# Patient Record
Sex: Female | Born: 1955 | Race: White | Hispanic: No | Marital: Married | Smoking: Former smoker
Health system: Southern US, Community
[De-identification: ages and names within clinical notes are randomized; demographics above are authoritative.]

## PROBLEM LIST (undated history)

## (undated) DIAGNOSIS — R7303 Prediabetes: Secondary | ICD-10-CM

## (undated) DIAGNOSIS — R32 Unspecified urinary incontinence: Secondary | ICD-10-CM

## (undated) DIAGNOSIS — Z9889 Other specified postprocedural states: Secondary | ICD-10-CM

## (undated) DIAGNOSIS — K219 Gastro-esophageal reflux disease without esophagitis: Secondary | ICD-10-CM

## (undated) DIAGNOSIS — E785 Hyperlipidemia, unspecified: Secondary | ICD-10-CM

## (undated) HISTORY — PX: CARDIAC CATHETERIZATION: SHX172

---

## 1980-09-20 HISTORY — PX: APPENDECTOMY: SHX54

## 1980-09-20 HISTORY — PX: ABDOMINAL HYSTERECTOMY: SHX81

## 2004-10-08 ENCOUNTER — Ambulatory Visit: Payer: Self-pay | Admitting: Family Medicine

## 2005-09-02 ENCOUNTER — Ambulatory Visit: Payer: Self-pay | Admitting: Gastroenterology

## 2007-05-02 ENCOUNTER — Ambulatory Visit: Payer: Self-pay | Admitting: Family Medicine

## 2007-05-09 ENCOUNTER — Ambulatory Visit: Payer: Self-pay | Admitting: Family Medicine

## 2007-05-09 HISTORY — PX: OTHER SURGICAL HISTORY: SHX169

## 2007-05-10 ENCOUNTER — Ambulatory Visit: Payer: Self-pay | Admitting: Family Medicine

## 2007-07-06 ENCOUNTER — Ambulatory Visit: Payer: Self-pay | Admitting: Cardiology

## 2007-07-14 ENCOUNTER — Ambulatory Visit: Payer: Self-pay | Admitting: Internal Medicine

## 2007-08-15 ENCOUNTER — Telehealth (INDEPENDENT_AMBULATORY_CARE_PROVIDER_SITE_OTHER): Payer: Self-pay | Admitting: *Deleted

## 2007-08-16 ENCOUNTER — Ambulatory Visit: Payer: Self-pay | Admitting: Cardiology

## 2007-09-05 ENCOUNTER — Encounter: Payer: Self-pay | Admitting: Family Medicine

## 2007-09-12 ENCOUNTER — Encounter: Payer: Self-pay | Admitting: Internal Medicine

## 2007-09-21 ENCOUNTER — Encounter: Payer: Self-pay | Admitting: Family Medicine

## 2007-10-04 ENCOUNTER — Encounter: Payer: Self-pay | Admitting: Internal Medicine

## 2007-10-22 ENCOUNTER — Encounter: Payer: Self-pay | Admitting: Family Medicine

## 2007-11-17 ENCOUNTER — Encounter: Payer: Self-pay | Admitting: Internal Medicine

## 2007-11-19 ENCOUNTER — Encounter: Payer: Self-pay | Admitting: Family Medicine

## 2007-11-24 ENCOUNTER — Ambulatory Visit (HOSPITAL_COMMUNITY): Admission: RE | Admit: 2007-11-24 | Discharge: 2007-11-24 | Payer: Self-pay | Admitting: Cardiology

## 2007-11-27 ENCOUNTER — Encounter: Payer: Self-pay | Admitting: Internal Medicine

## 2007-12-15 DIAGNOSIS — E785 Hyperlipidemia, unspecified: Secondary | ICD-10-CM | POA: Insufficient documentation

## 2007-12-18 ENCOUNTER — Ambulatory Visit: Payer: Self-pay | Admitting: Internal Medicine

## 2007-12-18 DIAGNOSIS — F17200 Nicotine dependence, unspecified, uncomplicated: Secondary | ICD-10-CM

## 2007-12-18 DIAGNOSIS — J4489 Other specified chronic obstructive pulmonary disease: Secondary | ICD-10-CM | POA: Insufficient documentation

## 2007-12-18 DIAGNOSIS — J449 Chronic obstructive pulmonary disease, unspecified: Secondary | ICD-10-CM

## 2008-01-09 ENCOUNTER — Ambulatory Visit: Payer: Self-pay | Admitting: Internal Medicine

## 2008-01-09 DIAGNOSIS — R0602 Shortness of breath: Secondary | ICD-10-CM | POA: Insufficient documentation

## 2008-01-10 ENCOUNTER — Encounter: Payer: Self-pay | Admitting: Internal Medicine

## 2008-01-23 ENCOUNTER — Ambulatory Visit (HOSPITAL_COMMUNITY): Admission: RE | Admit: 2008-01-23 | Discharge: 2008-01-23 | Payer: Self-pay | Admitting: Internal Medicine

## 2008-01-23 ENCOUNTER — Encounter: Payer: Self-pay | Admitting: Internal Medicine

## 2008-01-26 ENCOUNTER — Telehealth: Payer: Self-pay | Admitting: Internal Medicine

## 2008-02-13 ENCOUNTER — Ambulatory Visit: Payer: Self-pay | Admitting: Internal Medicine

## 2008-04-06 ENCOUNTER — Telehealth: Payer: Self-pay | Admitting: Internal Medicine

## 2008-04-15 ENCOUNTER — Ambulatory Visit: Payer: Self-pay | Admitting: Pulmonary Disease

## 2008-04-15 ENCOUNTER — Ambulatory Visit: Payer: Self-pay | Admitting: Internal Medicine

## 2008-04-19 ENCOUNTER — Telehealth: Payer: Self-pay | Admitting: Adult Health

## 2008-07-04 ENCOUNTER — Ambulatory Visit: Payer: Self-pay | Admitting: Family Medicine

## 2008-09-20 HISTORY — PX: CHOLECYSTECTOMY: SHX55

## 2008-12-03 ENCOUNTER — Ambulatory Visit: Payer: Self-pay | Admitting: Family Medicine

## 2009-02-03 ENCOUNTER — Ambulatory Visit: Payer: Self-pay | Admitting: Unknown Physician Specialty

## 2009-02-08 ENCOUNTER — Ambulatory Visit: Payer: Self-pay | Admitting: Unknown Physician Specialty

## 2009-02-12 ENCOUNTER — Ambulatory Visit: Payer: Self-pay

## 2009-02-28 ENCOUNTER — Ambulatory Visit: Payer: Self-pay | Admitting: Gastroenterology

## 2009-02-28 LAB — HM COLONOSCOPY

## 2009-03-18 ENCOUNTER — Ambulatory Visit: Payer: Self-pay | Admitting: Gastroenterology

## 2009-03-20 ENCOUNTER — Ambulatory Visit: Payer: Self-pay | Admitting: Surgery

## 2010-01-11 ENCOUNTER — Observation Stay: Payer: Self-pay | Admitting: Internal Medicine

## 2010-02-02 ENCOUNTER — Ambulatory Visit: Payer: Self-pay | Admitting: Gastroenterology

## 2010-02-02 HISTORY — PX: UPPER GI ENDOSCOPY: SHX6162

## 2010-02-16 ENCOUNTER — Ambulatory Visit: Payer: Self-pay | Admitting: Gastroenterology

## 2010-02-22 ENCOUNTER — Emergency Department: Payer: Self-pay | Admitting: Emergency Medicine

## 2010-11-07 IMAGING — NM NUCLEAR MEDICINE HEPATOHBILIARY INCLUDE GB
2 series · 14 of 14 positions shown · non-contrast
Comparison: none

REASON FOR EXAM: abd pain  change bowel habits  weight gain
COMMENTS:

[Series 1000: gallbladder statics · 4.80mm/px · 8 of 8 slices shown]
[im 1/8]
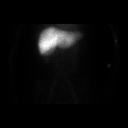
[im 2/8]
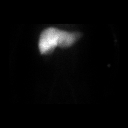
[im 3/8]
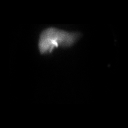
[im 4/8]
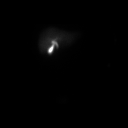
[im 5/8]
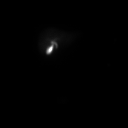
[im 6/8]
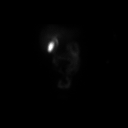
[im 7/8]
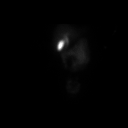
[im 8/8]
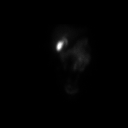

[Series 1000: gallbladder dynamic · 4.80mm/px · 6 of 60 frames shown]
[frame 6/60]
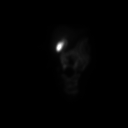
[frame 16/60]
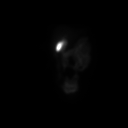
[frame 26/60]
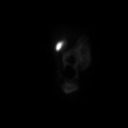
[frame 36/60]
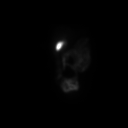
[frame 46/60]
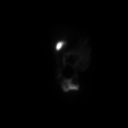
[frame 56/60]
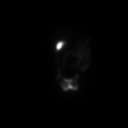

[14 of 14 positions shown; findings below may reference images not displayed]

PROCEDURE:     NM  - NM HEPATO WITH GB EJECT FRACTION  - February 08, 2009 [DATE]

RESULT:     The patient received an injection of 8.76  technetium 99m
Choletec. There is prompt extraction of tracer from the blood pool by the
liver. No hepatic lesion is evident. Common bile duct activity is present by
10 minutes with gallbladder activity present by 20 minutes. Small bowel
activity is seen by 30 to 40 minutes. A continuous infusion of sincalide was
initiated with a total dose of 1.11 mcg administered intravenously over 30
minutes. The gallbladder ejection fraction is low at 19%.
IMPRESSION: 1. Normal-appearing hepatobiliary scan.
2. Depressed gallbladder ejection fraction of 19% in response to sincalide
infusion.

## 2011-02-02 NOTE — Letter (Signed)
July 14, 2007    Cristy Hilts. Jacinto Halim, MD  [address]  [place], [place] [address]   RE:  DELESIA, MARTINEK  MRN:  [id number]  /  DOB:  1956/01/30   Dear Dr. Jacinto Halim:   Thank you for referring Rayan Dyal to my pulmonary clinic. It was a  pleasure to see her. Complaints are shortness of breath. 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 A year ago she was extremely active but then  started noticing tiredness with exertion and also periodic dry cough.  Around March 2008, she noticed dyspnea on exertion along with this  tiredness. In July 2008, she went on a hiking vacation and  experienced  a terrible episode while beginning to hike. She had severe  palpitations and shortness of breath. After that, she reported to your  clinic and by history had a  negative cardiovascular stress test.  Apparently after the stress test, she developed headache and workup in  August 2008 (CT scan of the head) showed a small stroke. She also  diagnosed to have type-2 diabetes at the same time. A month later, she  underwent MRI scanning of the head which confirmed the mini stroke.  She was then recommended aspirin and modification of risk factors.  However, her shortness of breath, fatigue, exhaustion, and dry cough  have persisted. She has undergone pulmonary function testing and is  being referred here.   Effort tolerance is less than one flight of stairs. Gets dyspneic when  carrying her groceries from the car to the home and unable to do her 45  minute walk with her daughter that she used to do before. Associated  symptoms include atypical chest pains, occasional wheezing, and the dry  cough. Of note, she denied hemoptysis, edema, paroxysmal nocturnal  dyspnea, post-nasal drip, and orthopnea.   PAST MEDICAL HISTORY:  1. No coronary artery disease.  2. Continued tobacco abuse. She is trying to quit.  3. Type-2 diabetes  mellitus diagnosed in August 2008.  4. Hyperlipidemia.  5. Mini-stroke in July 2008.   PAST SURGICAL HISTORY:  Status post hysterectomy in 1986.   MEDICATION ALLERGIES:  Allergic to CODEINE and MORPHINE. Unspecified.   MEDICATIONS:  1. Crestor 25 mg once daily.  2. Glucophage 500 mg once daily.  3. Aspirin 81 mg three times daily.   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  Her husband is an Art gallery manager. She has lived in [place]. She has smoked a  pack a day for the past 25 years. She is trying to quit on her own now.  She said that she has enough self-will to do that and she does not  require any help of medications.   GYNECOLOGIC HISTORY:  She is status post menopause.   FAMILY HISTORY:  Father and sister had heart disease. Clotting disorder  existed in father, mother, and paternal grandfather. Cancer affected  people including paternal grandfather, lung cancer in paternal  grandmother, breast cancer in maternal grandmother, and leukemia.   REVIEW OF SYSTEMS:  1. This is documented on the questionnaire. Significant is dysphagia      on and off for one year, and she says that it is not bad, but she      needs to swallow hard.  2.  Left infrascapular region, pain on and off for the past one year      that is increased with cough. Otherwise, the review of systems is      as in the history of present illness and documented in the      questionnaire.   PHYSICAL EXAMINATION:  VITAL SIGNS:  Temperature 98.2, blood pressure  118/82, pulse 88, saturation 97% on room air.  GENERAL:  Pleasant female seated comfortably in the exam room.  CENTRAL NERVOUS SYSTEM:  Awake, alert, and oriented x3. Speech and  ambulation normal.  CARDIOVASCULAR:  Normal heart sounds.  RESPIRATORY:  Normal breath sounds. No wheeze.  NECK:  No neck nodes. No elevated JVP.  ABDOMEN:  Soft and nontender.   EXTREMITIES:  No cyanosis, clubbing, or edema.  SKIN:  Intact.  MUSCULOSKELETAL:  No joint deformities.   LABORATORY DATA:  1. Echocardiogram from March 29, 2007 as reported in your referral      sheets only showed mild mitral regurgitation otherwise normal.  2. Stress test from March 31, 2007 showed EKG without ischemic changes.      She did exercise 7 METS.  3. Lower arterial duplex scan showed no evidence of arterial      insufficiency.  4. Liver function test on June 05, 2007 was normal.  5. CT scan of the head without contrast on May 02, 2007 showed tiny      lacunar infarction in the region of the left lenticular nucleus.  6. Carotid Dopplers on April 23, 2007 showed mild atherosclerotic      changes involving the right carotid bulb and left proximal internal      carotid artery.  7. Pulmonary function tests done at [facility] on July 06, 2007 showed an FEV1 1.26 liters 56%, FVC 2.31 liters 86%, FEV1/FVC      ratio that is reduced at 54%. The uncorrected DLCO is 12.3 mL per      millimeter of mercury per minute 71% of predicted. Flow volume loop      shows sawtooth pattern with an inspiration/expiration. In summary,      spirometry is consistent with Gold stage 2 obstruction and chronic      obstructive pulmonary disease.   ASSESSMENT AND PLAN:  Chronic heavy smoker with dyspnea on exertion and  cough for the past one year consistent with PFTs with Gold stage 2  chronic obstructive pulmonary disease. Her symptoms are consistent with  chronic obstructive pulmonary disease. In addition, she has had  recurrent TIA with a lacunar stroke recently, but without any neurologic  deficits. Cardiac stress test is negative.   PROBLEM LIST:  1. Shortness of breath, wheezing, cough. Symptoms are consistent with      chronic obstructive pulmonary disease and spirometry shows she has      Gold stage 2 chronic obstructive pulmonary disease. The plan is for      her to  have Spiriva 1 puff daily maintenance. I strongly      recommended pulmonary rehabilitation. I advised flu shot today.      Pneumovax will be given at the next visit. She is also strongly      advised quit smoking. She currently wants to quit by herself. We      will follow this up.   1. Dysphagia. Expectant follow up for this for now.   1. Left infrascapular pain. I think that this might be related to some  cough and chronic obstructive pulmonary disease symptoms. Again, we      will see how this pans out after the initiation of steroid      inhalers.   1. Tobacco abuse. She is going to quit on her own. I have offered her      help with medications if she felt that she would need it.   Return to clinic in 3 to 6 months after pulmonary rehab.    Sincerely,      Kalman Shan, MD  Electronically Signed    MR/MedQ  DD: 07/17/2007  DT: 07/17/2007  Job #: [id number]

## 2011-02-02 NOTE — Cardiovascular Report (Signed)
NAMEMATTISON, GOLAY             ACCOUNT NO.:  [id number]   MEDICAL RECORD NO.:  [id number]          PATIENT TYPE:  OIB   LOCATION:  2853                         FACILITY:  [facility]   PHYSICIAN:  Cristy Hilts. Jacinto Halim, MD       DATE OF BIRTH:  [birth year]   DATE OF PROCEDURE:  DATE OF DISCHARGE:                            CARDIAC CATHETERIZATION   PROCEDURE PERFORMED:  1. Right heart catheterization.  2. Left heart catheterization including:      a.     Left ventriculography.      b.     Selective right and left coronary arteriography.   INDICATIONS:  Ms. Hochman is a pleasant 55 year old female with history  of prior stroke, history of smoking, new onset diabetes and severe  obstructive pulmonary artery disease secondary to smoking who has been  complaining of chest discomfort.  She has previously undergone a stress  test which was negative for myocardial ischemia.  Because of persistent  chest discomfort and also shortness of breath.  She is brought to the  catheterization lab to evaluate for coronary artery disease and also to  evaluate pulmonary hypertension.   HEMODYNAMIC DATA:  Right heart catheterization.   RA pressure 2/2 with a mean of 0 mmHg.  RV pressure 16 with end-  diastolic pressure of 4 mmHg.   PA pressure 16/-1 with a mean of 6 mmHg.   Pulmonary capillary wedge 6/4 with a mean of 2 mmHg.   PA saturation was 70% and aortic saturation of 93%.   Cardiac output of 5.1 with a cardiac index of 3.4 by Fick.   HEMODYNAMIC DATA:  Left heart catheterization.   Left ventricular pressure was 104 with end diastolic pressure of 5 mmHg.  Aortic pressure was 104/62 mmHg.  There was no pressure gradient across  the aortic valve.   ANGIOGRAPHIC DATA:  Left ventricle:  Left ventricular systolic function  was normal with ejection fraction of 60-65%.  There was no significant  mitral regurgitation.   Right coronary artery:  Right coronary artery is a large vessel that is  a dominant  vessel.  It is smooth and normal.   Left main coronary artery:  Left main coronary is a large vessel.  Smooth and normal.  Circumflex coronary artery:  Circumflex is large vessel.  It gives  origin to moderate-sized obtuse marginal one.  Smooth and normal.   LAD:  LAD is a moderate-to-large caliber vessel.  It has a proximal mild  calcification and soft eccentric plaque constituting a 20% stenosis.  Otherwise, the LAD itself is smooth and normal.   IMPRESSION:  Mild calcification and soft eccentric plaque in the ostial proximal LAD  constituting about 20% stenosis.  Normal right heart catheterization  pressures without evidence of pulmonary tension.  Normal LV systolic  function.   RECOMMENDATIONS:  Based on angiographic data, aggressive risk  modification is indicated.  The patient will be discharged home today  with outpatient follow-up.  A total of 6 mL of contrast was utilized for  diagnostic angiography.   TECHNIQUE/PROCEDURE:  Under usual sterile precautions using a 7-French  right femoral venous and a 6-French right femoral arterial access, right  and left heart catheterization was performed.   Using a balloon-tip Swan-Ganz catheter which was advanced to the venous  sheath, pulmonary right-sided hemodynamics were carefully performed and  the data was carefully analyzed.  The catheter then pulled out of body.   A 6-French multipurpose B2 catheter was advanced into the ascending  aorta and then into the left ventricle over a J-wire and left  ventriculography was performed both in LAO and RAO projection.  Catheter  was then pulled into the ascending aorta, right coronary artery was  selectively engaged and angiography was performed.  Then the left main  coronary artery was selectively engaged and angiography was performed.  The catheter was pulled out of the body in the usual fashion.  The  patient tolerated procedure well, there were no immediate complications.      Cristy Hilts. Jacinto Halim, MD  Electronically Signed     JRG/MEDQ  D:  11/24/2007  T:  11/26/2007  Job:  [id number]   cc:   Nilda Simmer, M.D.  Cristy Hilts. Jacinto Halim, MD

## 2011-06-14 LAB — POCT I-STAT 3, VENOUS BLOOD GAS (G3P V)
Bicarbonate: 23.8
O2 Saturation: 70
O2 Saturation: 71
TCO2: 25
TCO2: 27
pCO2, Ven: 45.5
pH, Ven: 7.329 — ABNORMAL HIGH
pO2, Ven: 39

## 2011-06-14 LAB — POCT I-STAT 3, ART BLOOD GAS (G3+)
TCO2: 26
pCO2 arterial: 41.5
pH, Arterial: 7.388
pO2, Arterial: 68 — ABNORMAL LOW

## 2011-10-10 IMAGING — CT CT HEAD WITHOUT CONTRAST
2 series · 15 of 30 positions shown, 19 images · non-contrast
Comparison: none

REASON FOR EXAM: focal neuro weakness - difficulty swallowing
COMMENTS:

PROCEDURE:     CT  - CT HEAD WITHOUT CONTRAST  - January 11, 2010 [DATE]
RESULT:     Comparison:  12/03/2008
TECHNIQUE: Multiple axial images from the foramen magnum to the vertex were
obtained without IV contrast.

[Series 2: without · axial · non-contrast · 0.40mm/px · z∈[+316,+436]mm · 13 of 30 slices shown, 17 images]
[im 3/30  brain]
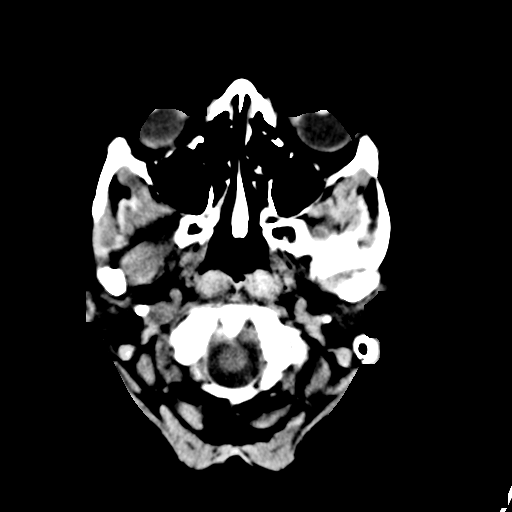
[im 3/30  bone]
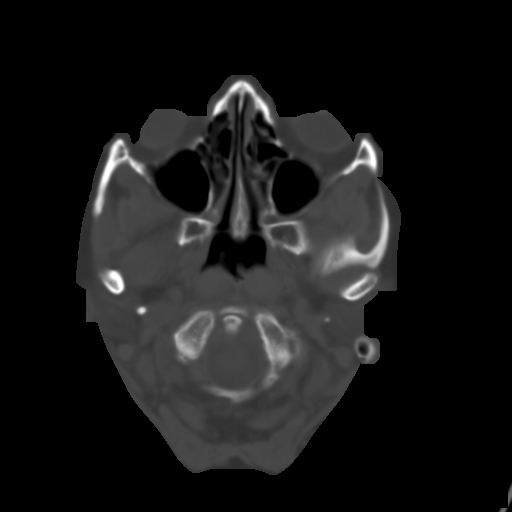
[im 5/30  brain]
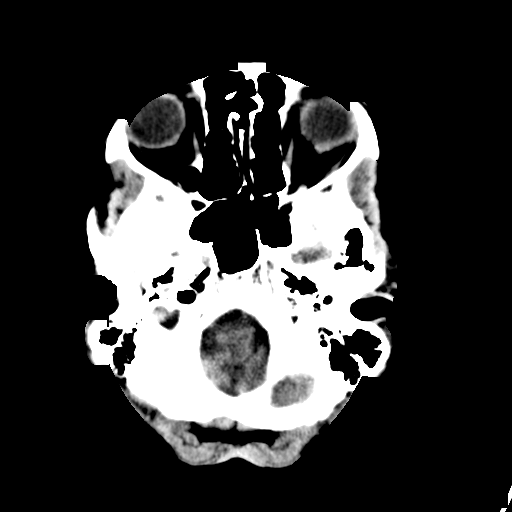
[im 7/30  brain]
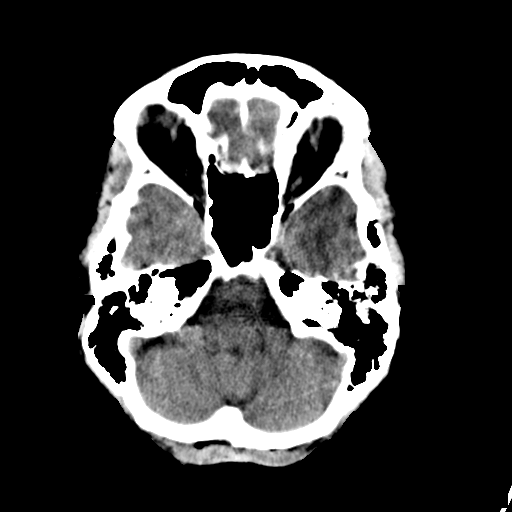
[im 9/30  brain]
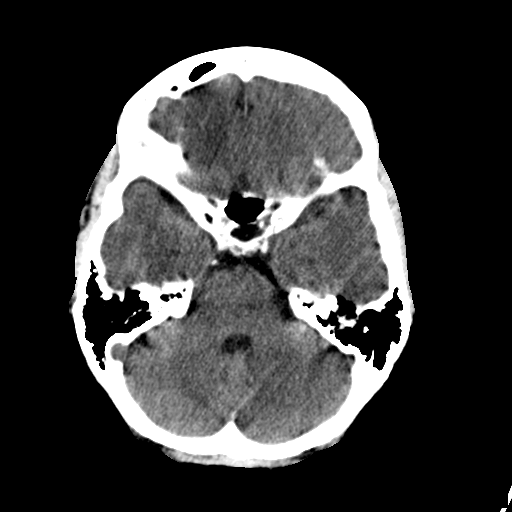
[im 11/30  brain]
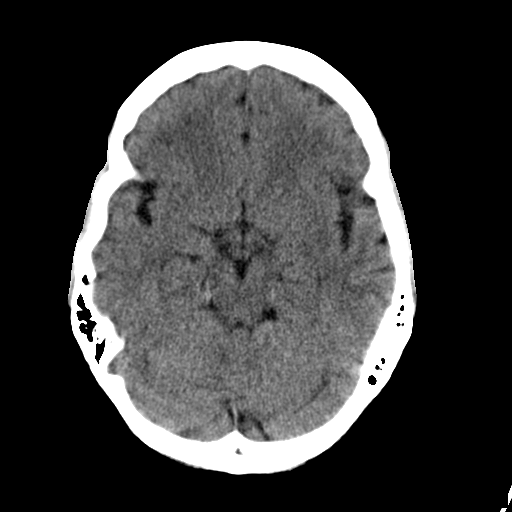
[im 11/30  bone]
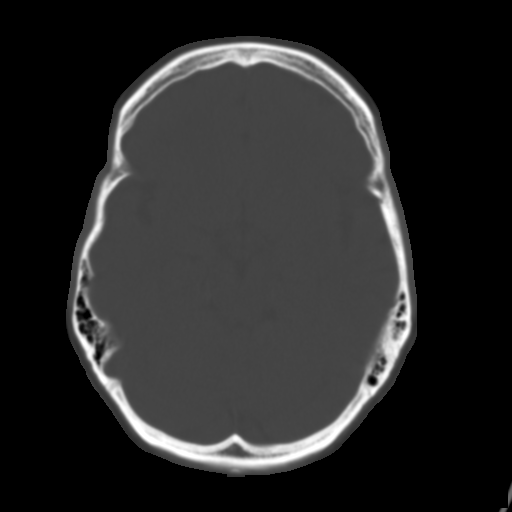
[im 13/30  brain]
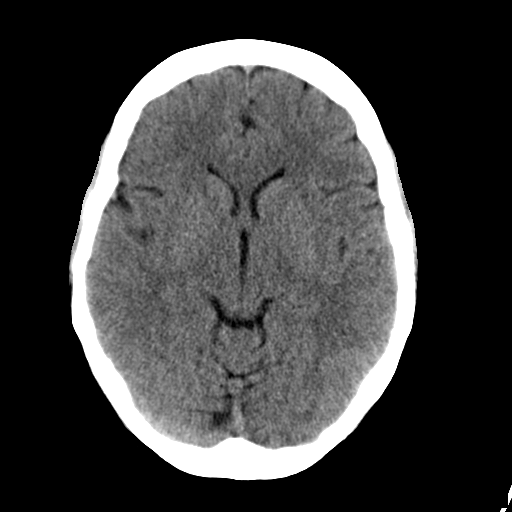
[im 15/30  brain]
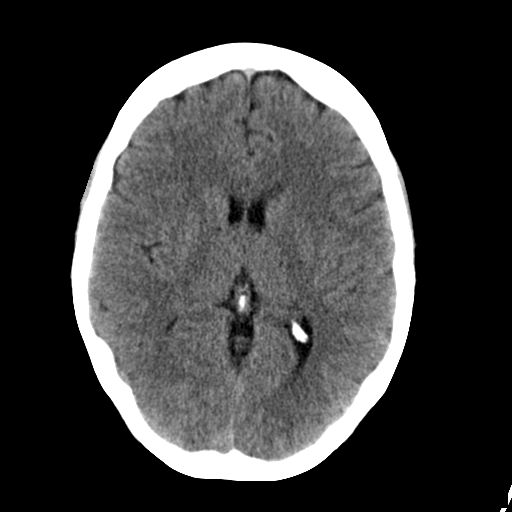
[im 17/30  brain]
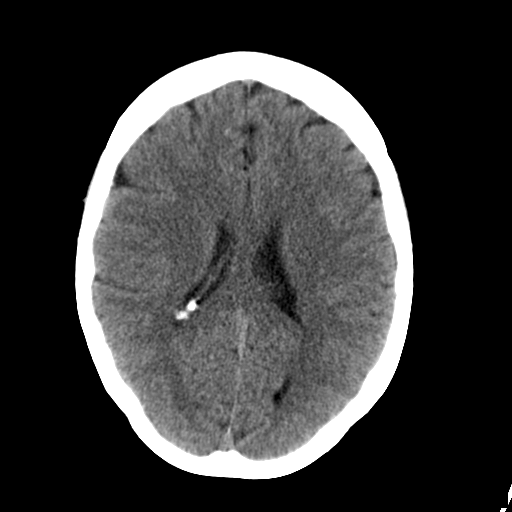
[im 19/30  brain]
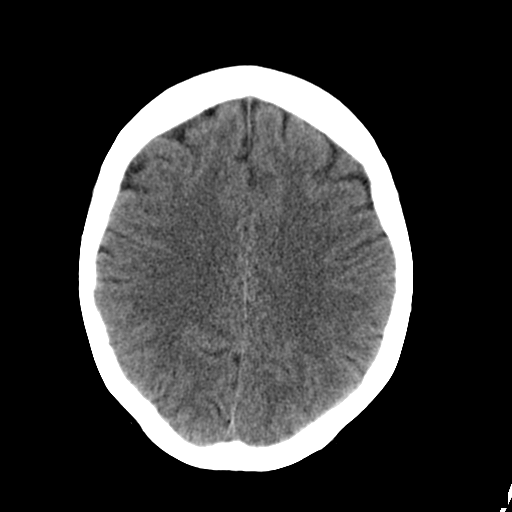
[im 19/30  bone]
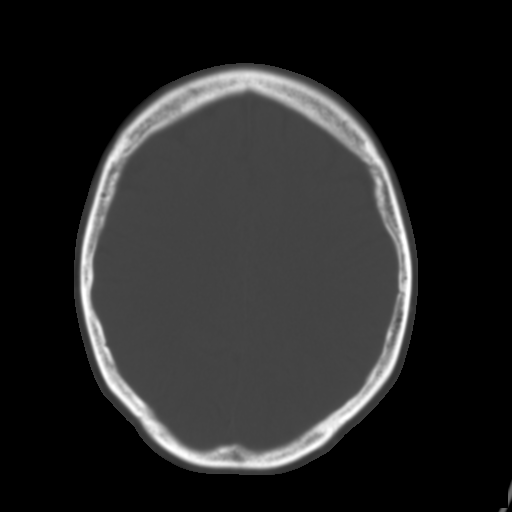
[im 21/30  brain]
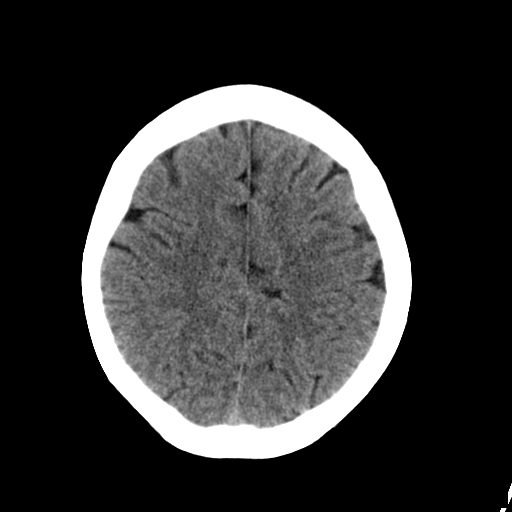
[im 23/30  brain]
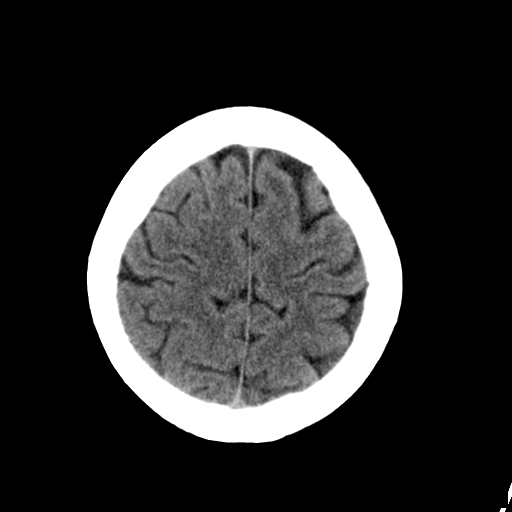
[im 25/30  brain]
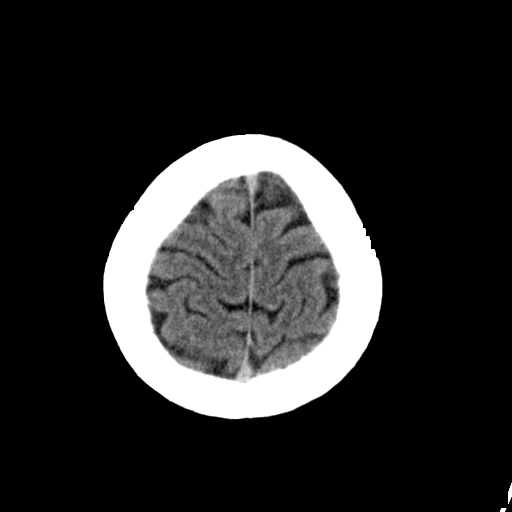
[im 27/30  brain]
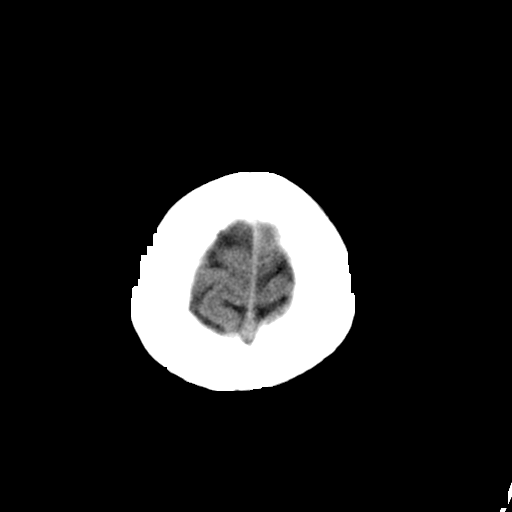
[im 27/30  bone]
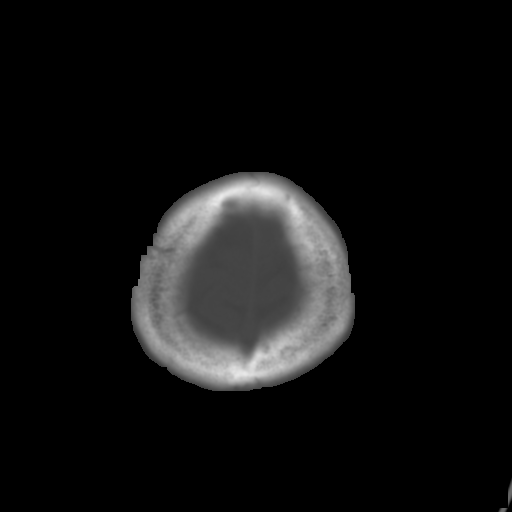

[Series 3: bone · axial · 0.40mm/px · z∈[+316,+336]mm · 2 of 30 slices shown]
[im 3/30  bone]
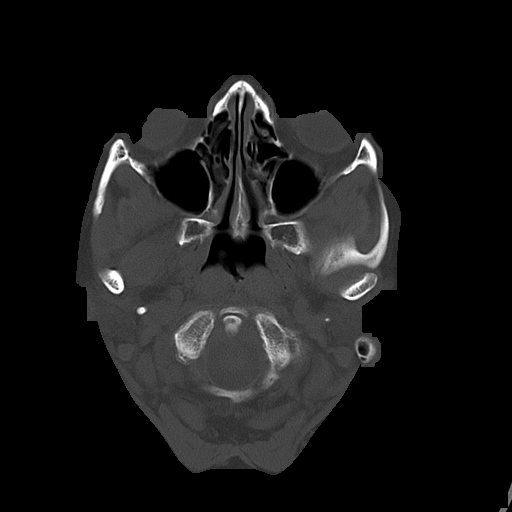
[im 7/30  bone]
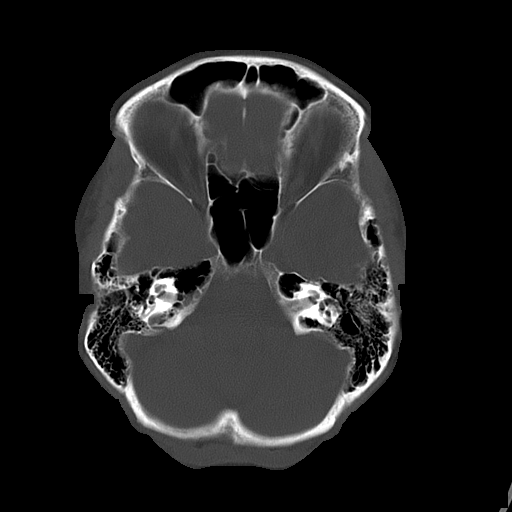

[15 of 30 positions shown; findings below may reference images not displayed]

FINDINGS: There is no evidence of mass effect, midline shift, or extra-axial fluid
collections.  There is no evidence of a space-occupying lesion or
intracranial hemorrhage. There is no evidence of a cortical-based area of
acute infarction.

The ventricles and sulci are appropriate for the patient's age. The basal
cisterns are patent.

Visualized portions of the orbits are unremarkable. The visualized portions
of the paranasal sinuses and mastoid air cells are unremarkable.

The osseous structures are unremarkable.
IMPRESSION: No acute intracranial process.

CT can underestimate ischemia in the first 24 hours after the event. If
there is clinical concern for an acute infarct, a followup MRI or repeat CT
scan in 24 hours may provide additional information.

## 2013-09-11 ENCOUNTER — Ambulatory Visit: Payer: Self-pay | Admitting: Family Medicine

## 2014-06-11 ENCOUNTER — Ambulatory Visit: Payer: Self-pay | Admitting: Family Medicine

## 2015-01-17 LAB — HM MAMMOGRAPHY

## 2016-08-04 ENCOUNTER — Encounter: Payer: Self-pay | Admitting: Family Medicine

## 2016-09-01 ENCOUNTER — Ambulatory Visit (INDEPENDENT_AMBULATORY_CARE_PROVIDER_SITE_OTHER): Payer: BLUE CROSS/BLUE SHIELD | Admitting: Family Medicine

## 2016-09-01 ENCOUNTER — Encounter: Payer: Self-pay | Admitting: Family Medicine

## 2016-09-01 VITALS — BP 108/64 | HR 72 | Temp 98.6°F | Resp 16 | Ht 60.0 in | Wt 126.0 lb

## 2016-09-01 DIAGNOSIS — Z1231 Encounter for screening mammogram for malignant neoplasm of breast: Secondary | ICD-10-CM

## 2016-09-01 DIAGNOSIS — Z0000 Encounter for general adult medical examination without abnormal findings: Secondary | ICD-10-CM | POA: Diagnosis not present

## 2016-09-01 DIAGNOSIS — R739 Hyperglycemia, unspecified: Secondary | ICD-10-CM | POA: Diagnosis not present

## 2016-09-01 DIAGNOSIS — E785 Hyperlipidemia, unspecified: Secondary | ICD-10-CM | POA: Diagnosis not present

## 2016-09-01 DIAGNOSIS — Z1239 Encounter for other screening for malignant neoplasm of breast: Secondary | ICD-10-CM

## 2016-09-01 DIAGNOSIS — Z87891 Personal history of nicotine dependence: Secondary | ICD-10-CM

## 2016-09-01 NOTE — Progress Notes (Signed)
Patient: Erika Smith, Female    DOB: 1955-10-30, 60 y.o.   MRN: [id number] Visit Date: 09/01/2016  Today's Provider: Mila Merryonald Fisher, MD   Chief Complaint  Patient presents with  . Annual Exam   Subjective:    Annual physical exam Erika Smith is a 60 y.o. female who presents today for health maintenance and complete physical. She feels fairly well. She reports exercising not regularly. She reports she is sleeping well.  Mammogram- 01/17/2015. Normal. Colonoscopy- 02/28/2009. Internal hemorrhoids, otherwise normal.     Lipid/Cholesterol, Follow-up:   Last seen for this2 years ago.  Management changes since that visit include no changes. . Last Lipid Panel: No results found for: CHOL, TRIG, HDL, CHOLHDL, VLDL, LDLCALC, LDLDIRECT   She reports good compliance with treatment. She is not having side effects.  Current symptoms include none and have been stable. Weight trend: stable Prior visit with dietician: no Current diet: well balanced Current exercise: walking  Wt Readings from Last 3 Encounters:  09/01/16 126 lb (57.2 kg)  04/15/08 122 lb 2.1 oz (55.4 kg)  01/09/08 119 lb (54 kg)     She reports history of diabetes previously on metformin, but made significant improved to diet and has been off medications for several years. She wa also on Crestor in the past  Review of Systems  Constitutional: Negative.   HENT: Positive for congestion and sore throat.   Eyes: Negative.   Respiratory: Positive for cough.   Cardiovascular: Negative.   Gastrointestinal: Negative.   Endocrine: Negative.   Genitourinary: Negative.   Musculoskeletal: Negative.   Skin: Negative.   Allergic/Immunologic: Negative.   Neurological: Negative.   Hematological: Negative.   Psychiatric/Behavioral: Negative.     Social History      She  reports that she quit smoking about 9 years ago. She does not have any smokeless tobacco history on file. She reports that she does  not drink alcohol or use drugs.       Social History   Social History  . Marital status: Married    Spouse name: N/A  . Number of children: 2  . 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 Smoking status: Former Smoker    Quit date: 2008  . Smokeless tobacco: None  . Alcohol use No  . Drug use: No  . Sexual activity: Not Asked   Other Topics Concern  . None   Social History Narrative  . None    No past medical history on file.   Patient Active Problem List   Diagnosis Date Noted  . SHORTNESS OF BREATH (SOB) 01/09/2008  . TOBACCO ABUSE 12/18/2007  . Chronic airway obstruction, not elsewhere classified 12/18/2007  . HYPERLIPIDEMIA 12/15/2007    Past Surgical History:  Procedure Laterality Date  . ABDOMINAL HYSTERECTOMY  1982   BSO  . APPENDECTOMY  1982  . CARDIAC CATHETERIZATION     11/2007  . CHOLECYSTECTOMY  2010  . MR Brain, Brain Stem  05/09/2007    Normal MRI.  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 UPPER GI ENDOSCOPY  02/02/2010   Dr. Bluford Kaufmannh. LA Grade A reflux esophagitis.    Family History        Family Status  Relation Status  . Mother Deceased   cause of death: Stroke  .  Father Alive   had CABG  . Sister Alive  . Brother Alive  . Other   . Sister Alive  . Brother Alive        Her family history includes Allergies in her sister; Asthma in her sister; Diabetes Mellitus II in her father; Heart disease in her father; Leukemia in her other; Stroke in her father and mother.     Allergies  Allergen Reactions  . Codeine   . Morphine   . Percocet  [Oxycodone-Acetaminophen]   . Tramadol Hcl      Current Outpatient Prescriptions:  Marland Kitchen.  Multiple Vitamins-Minerals (MULTIVITAMIN ADULT PO), Take 1 tablet by mouth daily., Disp: , Rfl:    Patient Care Team: Malva Limesonald E Fisher, MD as PCP - General (Family Medicine)       Objective:   Vitals: BP 108/64 (BP Location: Right Arm, Patient Position: Sitting, Cuff Size: Normal)   Pulse 72   Temp 98.6 F (37 C)   Resp 16   Ht 5' (1.524 m)   Wt 126 lb (57.2 kg)   BMI 24.61 kg/m    Physical Exam  Constitutional: She is oriented to person, place, and time. She appears well-developed and well-nourished.  HENT:  Head: Normocephalic and atraumatic.  Right Ear: External ear normal.  Nose: Nose normal.  Mouth/Throat: Oropharynx is clear and moist.  Eyes: Conjunctivae and EOM are normal. Pupils are equal, round, and reactive to light.  Cardiovascular: Normal rate, regular rhythm and normal heart sounds.   Pulmonary/Chest: Effort normal and breath sounds normal. Right breast exhibits no inverted nipple, no mass, no nipple discharge, no skin change and no tenderness. Left breast exhibits no inverted nipple, no mass, no nipple discharge, no skin change and no tenderness. Breasts are symmetrical.  Abdominal: Soft.  Musculoskeletal: Normal range of motion.  Neurological: She is alert and oriented to person, place, and time.  Skin: Skin is warm and dry.  Psychiatric: She has a normal mood and affect. Her behavior is normal. Judgment and thought content normal.     Depression Screen No flowsheet data found.    Assessment & Plan:     Routine Health Maintenance and Physical Exam  Exercise Activities and Dietary recommendations Goals    None      Immunization History  Administered Date(s) Administered  . Pneumococcal Polysaccharide-23 07/19/2007  . Td 04/29/2005    Health Maintenance  Topic Date Due  . Hepatitis C Screening  11/09/1955  . HIV Screening  08/18/1969  . PAP SMEAR  08/19/1975  . ZOSTAVAX  08/18/2014  . TETANUS/TDAP  04/30/2015  . INFLUENZA VACCINE  04/20/2016  . MAMMOGRAM  01/16/2017  . COLONOSCOPY  03/01/2019     Discussed health benefits of physical activity, and encouraged her to engage in regular exercise appropriate for her  age and condition.   1. Annual physical exam   2. History of smoking 30 or more pack years  - CT CHEST LUNG CA SCREEN LOW DOSE W/O CM; Future  3. Hyperlipidemia, unspecified hyperlipidemia type  - Lipid panel - Comprehensive metabolic panel  4. Hyperglycemia  - Hemoglobin A1c  5. Breast cancer screening  - MM Digital Screening; Future     Mila Merryonald Fisher, MD  [facility]

## 2016-09-04 LAB — LIPID PANEL
Chol/HDL Ratio: 2.6 ratio units (ref 0.0–4.4)
Cholesterol, Total: 233 mg/dL — ABNORMAL HIGH (ref 100–199)
HDL: 90 mg/dL (ref >39)
LDL CALC: 128 mg/dL — AB (ref 0–99)
Triglycerides: 75 mg/dL (ref 0–149)
VLDL CHOLESTEROL CAL: 15 mg/dL (ref 5–40)

## 2016-09-04 LAB — COMPREHENSIVE METABOLIC PANEL
ALBUMIN: 4.4 g/dL (ref 3.6–4.8)
ALK PHOS: 85 IU/L (ref 39–117)
ALT: 16 IU/L (ref 0–32)
AST: 17 IU/L (ref 0–40)
Albumin/Globulin Ratio: 1.8 (ref 1.2–2.2)
BUN/Creatinine Ratio: 30 — ABNORMAL HIGH (ref 12–28)
BUN: 20 mg/dL (ref 8–27)
Bilirubin Total: 0.3 mg/dL (ref 0.0–1.2)
CALCIUM: 9.7 mg/dL (ref 8.7–10.3)
CO2: 24 mmol/L (ref 18–29)
CREATININE: 0.67 mg/dL (ref 0.57–1.00)
Chloride: 100 mmol/L (ref 96–106)
GFR calc Af Amer: 109 mL/min/{1.73_m2} (ref >59)
GFR, EST NON AFRICAN AMERICAN: 95 mL/min/{1.73_m2} (ref >59)
GLUCOSE: 96 mg/dL (ref 65–99)
Globulin, Total: 2.4 g/dL (ref 1.5–4.5)
Potassium: 4.2 mmol/L (ref 3.5–5.2)
Sodium: 140 mmol/L (ref 134–144)
Total Protein: 6.8 g/dL (ref 6.0–8.5)

## 2016-09-04 LAB — HEMOGLOBIN A1C
ESTIMATED AVERAGE GLUCOSE: 120 mg/dL
HEMOGLOBIN A1C: 5.8 % — AB (ref 4.8–5.6)

## 2016-09-08 ENCOUNTER — Telehealth: Payer: Self-pay | Admitting: *Deleted

## 2016-09-08 ENCOUNTER — Other Ambulatory Visit: Payer: Self-pay | Admitting: *Deleted

## 2016-09-08 DIAGNOSIS — Z87891 Personal history of nicotine dependence: Secondary | ICD-10-CM

## 2016-09-08 NOTE — Telephone Encounter (Signed)
Received referral for initial lung cancer screening scan. Contacted patient and obtained smoking history,(former, quit 09/20/2006, 48 pack year) as well as answering questions related to screening process. Patient denies signs of lung cancer such as weight loss or hemoptysis. Patient denies comorbidity that would prevent curative treatment if lung cancer were found. Patient is tentatively scheduled for shared decision making visit and CT scan on 09/21/16 at 1:30pm, pending insurance approval from business office.

## 2016-09-21 ENCOUNTER — Ambulatory Visit: Payer: BLUE CROSS/BLUE SHIELD | Attending: Oncology

## 2016-09-21 ENCOUNTER — Inpatient Hospital Stay: Payer: BLUE CROSS/BLUE SHIELD | Admitting: Oncology

## 2016-09-23 ENCOUNTER — Telehealth: Payer: Self-pay | Admitting: *Deleted

## 2016-09-23 NOTE — Telephone Encounter (Signed)
Contacted in follow up of missed lung screening appointment. Patient is agreeable to appointment on 09/28/16 at 1:30pm.

## 2016-09-28 ENCOUNTER — Ambulatory Visit: Admission: RE | Admit: 2016-09-28 | Payer: BLUE CROSS/BLUE SHIELD | Source: Ambulatory Visit

## 2016-09-28 ENCOUNTER — Inpatient Hospital Stay: Payer: BLUE CROSS/BLUE SHIELD | Admitting: Oncology

## 2016-11-22 ENCOUNTER — Encounter: Payer: Self-pay | Admitting: *Deleted

## 2017-06-21 ENCOUNTER — Other Ambulatory Visit: Payer: Self-pay | Admitting: Orthopedic Surgery

## 2017-06-21 DIAGNOSIS — S42252D Displaced fracture of greater tuberosity of left humerus, subsequent encounter for fracture with routine healing: Secondary | ICD-10-CM

## 2017-06-29 ENCOUNTER — Ambulatory Visit
Admission: RE | Admit: 2017-06-29 | Discharge: 2017-06-29 | Disposition: A | Discharge status: Home / Self Care | Payer: BLUE CROSS/BLUE SHIELD | Source: Ambulatory Visit | Attending: Orthopedic Surgery | Admitting: Orthopedic Surgery

## 2017-06-29 DIAGNOSIS — S42214D Unspecified nondisplaced fracture of surgical neck of right humerus, subsequent encounter for fracture with routine healing: Secondary | ICD-10-CM | POA: Diagnosis not present

## 2017-06-29 DIAGNOSIS — M7581 Other shoulder lesions, right shoulder: Secondary | ICD-10-CM | POA: Insufficient documentation

## 2017-06-29 DIAGNOSIS — X58XXXD Exposure to other specified factors, subsequent encounter: Secondary | ICD-10-CM | POA: Insufficient documentation

## 2017-06-29 DIAGNOSIS — S42252D Displaced fracture of greater tuberosity of left humerus, subsequent encounter for fracture with routine healing: Secondary | ICD-10-CM

## 2017-06-29 DIAGNOSIS — S42254D Nondisplaced fracture of greater tuberosity of right humerus, subsequent encounter for fracture with routine healing: Secondary | ICD-10-CM | POA: Diagnosis present

## 2018-02-28 ENCOUNTER — Ambulatory Visit
Admission: RE | Admit: 2018-02-28 | Discharge: 2018-02-28 | Disposition: A | Discharge status: Home / Self Care | Payer: BLUE CROSS/BLUE SHIELD | Source: Ambulatory Visit | Attending: Family Medicine | Admitting: Family Medicine

## 2018-02-28 ENCOUNTER — Encounter: Payer: Self-pay | Admitting: Family Medicine

## 2018-02-28 ENCOUNTER — Ambulatory Visit: Payer: BLUE CROSS/BLUE SHIELD | Admitting: Family Medicine

## 2018-02-28 VITALS — BP 130/70 | HR 95 | Temp 98.4°F | Resp 16 | Wt 136.0 lb

## 2018-02-28 DIAGNOSIS — M5442 Lumbago with sciatica, left side: Secondary | ICD-10-CM

## 2018-02-28 DIAGNOSIS — M25552 Pain in left hip: Secondary | ICD-10-CM

## 2018-02-28 DIAGNOSIS — R29898 Other symptoms and signs involving the musculoskeletal system: Secondary | ICD-10-CM

## 2018-02-28 DIAGNOSIS — M5441 Lumbago with sciatica, right side: Secondary | ICD-10-CM | POA: Diagnosis not present

## 2018-02-28 DIAGNOSIS — M47896 Other spondylosis, lumbar region: Secondary | ICD-10-CM | POA: Insufficient documentation

## 2018-02-28 NOTE — Progress Notes (Signed)
Patient: Erika Smith Female    DOB: [birth year]   62 y.o.   MRN: [id number] Visit Date: 02/28/2018  Today's Provider: Lelon Huh, MD   Chief Complaint  Patient presents with  . Leg Pain    x 3-4 weeks   Subjective:    Leg Pain   Incident onset: 3-4 weeks ago. There was no injury mechanism. The pain is present in the left leg, right leg and left hip (left hip pain started yesterday). The quality of the pain is described as aching. The pain has been worsening since onset. Associated symptoms include muscle weakness and numbness (in legs). Nothing aggravates the symptoms. She has tried acetaminophen and NSAIDs for the symptoms. The treatment provided mild relief.  Pain seems to worsen at night an is keeping her from sleeping. The leg pains and weakness have been going on a few months, but her left hip just started hurting today. She has not had any falls, but her legs feel weak. She has had a little back pain starting today. Has had no injuries that she is aware of.      Allergies  Allergen Reactions  . Codeine   . Morphine   . Percocet  [Oxycodone-Acetaminophen]   . Tramadol Hcl      Current Outpatient Medications:  Marland Kitchen  Multiple Vitamins-Minerals (MULTIVITAMIN ADULT PO), Take 1 tablet by mouth daily., Disp: , Rfl:   Review of Systems  Constitutional: Positive for fatigue. Negative for appetite change, chills and fever.  Respiratory: Negative for chest tightness and shortness of breath.   Cardiovascular: Negative for chest pain and palpitations.  Gastrointestinal: Negative for abdominal pain, nausea and vomiting.  Musculoskeletal: Positive for arthralgias (left hip) and myalgias (both legs).  Neurological: Positive for weakness and numbness (in legs). Negative for dizziness.  Psychiatric/Behavioral: Positive for sleep disturbance.    Social History   Tobacco Use  . Smoking status: Former Smoker    Packs/day: 1.50    Years: 32.00    Pack years: 48.00   Start date: 1976    Last attempt to quit: 2008    Years since quitting: 11.4  . Smokeless tobacco: Never Used  Substance Use Topics  . Alcohol use: No   Objective:   BP 130/70 (BP Location: Left Arm, Patient Position: Sitting, Cuff Size: Normal)   Pulse 95   Temp 98.4 F (36.9 C) (Oral)   Resp 16   Wt 136 lb (61.7 kg)   SpO2 98% Comment: room  air  BMI 26.56 kg/m  Vitals:   02/28/18 1422  BP: 130/70  Pulse: 95  Resp: 16  Temp: 98.4 F (36.9 C)  TempSrc: Oral  SpO2: 98%  Weight: 136 lb (61.7 kg)     Physical Exam  General appearance: alert, well developed, well nourished, cooperative and in no distress Head: Normocephalic, without obvious abnormality, atraumatic Respiratory: Respirations even and unlabored, normal respiratory rate Extremities: Minimal lower mid back tenderness. Straight leg negative, but hip flexion strength is +4 bilaterally. DTRs are +3.      Assessment & Plan:     1. Left hip pain  - DG HIP UNILAT WITH PELVIS 2-3 VIEWS LEFT; Future  2. Weakness of both lower extremities  - DG Lumbar Spine Complete; Future - CBC - CK (Creatine Kinase) - Sed Rate (ESR)  3. Bilateral low back pain with bilateral sciatica, unspecified chronicity  - DG Lumbar Spine Complete; Future       Lelon Huh, MD  [facility]

## 2018-03-01 ENCOUNTER — Telehealth: Payer: Self-pay

## 2018-03-01 ENCOUNTER — Telehealth: Payer: Self-pay | Admitting: Family Medicine

## 2018-03-01 DIAGNOSIS — M479 Spondylosis, unspecified: Secondary | ICD-10-CM

## 2018-03-01 LAB — CBC
Hematocrit: 36.2 % (ref 34.0–46.6)
Hemoglobin: 12.2 g/dL (ref 11.1–15.9)
MCH: 31.8 pg (ref 26.6–33.0)
MCHC: 33.7 g/dL (ref 31.5–35.7)
MCV: 94 fL (ref 79–97)
PLATELETS: 250 10*3/uL (ref 150–450)
RBC: 3.84 x10E6/uL (ref 3.77–5.28)
RDW: 12.8 % (ref 12.3–15.4)
WBC: 6.2 10*3/uL (ref 3.4–10.8)

## 2018-03-01 LAB — SEDIMENTATION RATE: SED RATE: 10 mm/h (ref 0–40)

## 2018-03-01 LAB — CK: CK TOTAL: 93 U/L (ref 24–173)

## 2018-03-01 MED ORDER — PREDNISONE 10 MG PO TABS: ORAL_TABLET | ORAL | 0 refills | Status: DC

## 2018-03-01 NOTE — Telephone Encounter (Signed)
Patient advised of results in separate message.

## 2018-03-01 NOTE — Telephone Encounter (Signed)
Pt called saying she had xrays and labs yesterday.  She seen Dr, Sherrie MustacheFisher.  She wants to know if we have the results back yet  Call back [phone]  [facility]

## 2018-03-01 NOTE — Telephone Encounter (Signed)
Patient advised of results and agrees with treatment plan. Prescription sent into the pharmacy. Follow up appointment scheduled 03/27/2018. This was the only time patient could come in because she would be out of town the previous week.

## 2018-03-01 NOTE — Telephone Encounter (Signed)
-----   Message from Malva Limesonald E Fisher, MD sent at 03/01/2018  8:27 AM EDT ----- Blood tests are normal. 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 This could cause some nerve irritation going to legs. Recommend 12 day course of prednisone and follow up o.v. 2-3 weeks.

## 2018-03-27 ENCOUNTER — Ambulatory Visit: Payer: Self-pay | Admitting: Family Medicine

## 2018-10-17 ENCOUNTER — Encounter: Payer: Self-pay | Admitting: Family Medicine

## 2018-11-14 ENCOUNTER — Encounter: Payer: Self-pay | Admitting: Family Medicine

## 2018-12-04 ENCOUNTER — Other Ambulatory Visit: Payer: Self-pay

## 2018-12-04 ENCOUNTER — Encounter: Payer: Self-pay | Admitting: Family Medicine

## 2018-12-04 ENCOUNTER — Ambulatory Visit (INDEPENDENT_AMBULATORY_CARE_PROVIDER_SITE_OTHER): Payer: Self-pay | Admitting: Family Medicine

## 2018-12-04 VITALS — BP 130/80 | HR 100 | Temp 98.2°F | Resp 16 | Wt 137.6 lb

## 2018-12-04 DIAGNOSIS — R079 Chest pain, unspecified: Secondary | ICD-10-CM

## 2018-12-04 NOTE — Progress Notes (Signed)
Patient: Erika Smith Female    DOB: 06/19/1956   63 y.o.   MRN: [id number] Visit Date: 12/04/2018  Today's Provider: Mila Merry, MD   Chief Complaint  Patient presents with  . Chest Pain   Subjective:     Chest Pain   This is a recurrent problem. The current episode started more than 1 month ago. The onset quality is sudden. The problem occurs intermittently. The problem has been waxing and waning. The pain is present in the substernal region. The pain is mild. The quality of the pain is described as sharp. The pain does not radiate. Associated symptoms include headaches. Pertinent negatives include no abdominal pain, back pain, claudication, cough, diaphoresis, dizziness, exertional chest pressure, fever, hemoptysis, irregular heartbeat, leg pain, lower extremity edema, malaise/fatigue, nausea, near-syncope, numbness, orthopnea, palpitations, PND, shortness of breath, sputum production, syncope, vomiting or weakness. The pain is aggravated by nothing. She has tried nothing for the symptoms.  Her family medical history is significant for heart disease.  Pains last 1-3 minutes like a stabbing, occurring a few times a week. Last episode was a week ago and is having no pain now.   Allergies  Allergen Reactions  . Codeine   . Morphine   . Percocet  [Oxycodone-Acetaminophen]   . Tramadol Hcl      Current Outpatient Medications:  Marland Kitchen  Multiple Vitamins-Minerals (MULTIVITAMIN ADULT PO), Take 1 tablet by mouth daily., Disp: , Rfl:   Review of Systems  Constitutional: Negative for diaphoresis, fever and malaise/fatigue.  Respiratory: Negative for cough, hemoptysis, sputum production and shortness of breath.   Cardiovascular: Positive for chest pain. Negative for palpitations, orthopnea, claudication, syncope, PND and near-syncope.  Gastrointestinal: Negative for abdominal pain, nausea and vomiting.  Musculoskeletal: Negative for back pain.  Neurological: Positive for  headaches. Negative for dizziness, weakness and numbness.    Social History   Tobacco Use  . Smoking status: Former Smoker    Packs/day: 1.50    Years: 32.00    Pack years: 48.00    Start date: 1976    Last attempt to quit: 2008    Years since quitting: 12.2  . Smokeless tobacco: Never Used  Substance Use Topics  . Alcohol use: No   .family history includes Allergies in her sister; Asthma in her sister; Cancer - Other in her sister; Congestive Heart Failure in her father; Diabetes in her brother; Diabetes Mellitus II in her father; Heart disease in her brother, father, and sister; Leukemia in an other family member; Stroke in her father and mother.   Objective:   BP 130/80   Pulse 100   Temp 98.2 F (36.8 C) (Oral)   Resp 16   Wt 137 lb 9.6 oz (62.4 kg)   SpO2 98%   BMI 26.87 kg/m  Vitals:   12/04/18 1353  BP: 130/80  Pulse: 100  Resp: 16  Temp: 98.2 F (36.8 C)  TempSrc: Oral  SpO2: 98%  Weight: 137 lb 9.6 oz (62.4 kg)     Physical Exam   General Appearance:    Alert, cooperative, no distress  Eyes:    PERRL, conjunctiva/corneas clear, EOM's intact       Lungs:     Clear to auscultation bilaterally, respirations unlabored  Heart:    Regular rate and rhythm  Neurologic:   Awake, alert, oriented x 3. No apparent focal neurological           defect.  EKG: no acute changes.     Assessment & Plan    1. Chest pain, unspecified type Not typical cardiac pain, but considering extensive family history of heart disease she needs further cardiac evaluation.  - EKG 12-Lead - NM Myocar Multi W/Spect W/Wall Motion / EF; Future    Patient seen and examined by Dr. Mila Merry note scribed by Sheliah Hatch, NCMA Mila Merry, MD  [facility],

## 2018-12-04 NOTE — Patient Instructions (Signed)
.   Please review the attached list of medications and notify my office if there are any errors.   . Please bring all of your medications to every appointment so we can make sure that our medication list is the same as yours.   

## 2018-12-07 ENCOUNTER — Telehealth: Payer: Self-pay | Admitting: Family Medicine

## 2018-12-07 DIAGNOSIS — R079 Chest pain, unspecified: Secondary | ICD-10-CM

## 2018-12-07 NOTE — Telephone Encounter (Signed)
Erika Smith  w/ NIA thru [employer]  Denied  - miocardio  perfusion Tracking # [id number]   Peer to Peer number [phone] to over turn denial.  Thanks, TGH

## 2018-12-07 NOTE — Telephone Encounter (Signed)
Pt's insurance company wants to know if there is a reason why exercise stress test has not been ordered for pt instead of [id number] - NM MYOCAR MULTI W/SPECT W/WALL MOTION / EF.You can contact them at [phone].Option 3 if you would like to speak peer to peer Tracking # [id number]

## 2018-12-07 NOTE — Telephone Encounter (Signed)
Called for peer to peer review. Myoview was still denied. They require EKG only test first. If negative they might cover additional tests. In that case I would probably just refer her to a cardiologist.  Have placed order for EKG only stress test.

## 2018-12-08 ENCOUNTER — Telehealth: Payer: Self-pay | Admitting: Family Medicine

## 2018-12-08 NOTE — Telephone Encounter (Signed)
Please advise patient we are having trouble getting stress scheduled because of Covid-19. Just to be safe she should take 81mg  aspirin once a day for her heart until we can get tests done.

## 2018-12-08 NOTE — Telephone Encounter (Signed)
LMTCB at [facility] to reschedule

## 2018-12-08 NOTE — Telephone Encounter (Signed)
Pt advised.   Thanks,   -Kimberlynn Lumbra  

## 2018-12-08 NOTE — Telephone Encounter (Signed)
She is having chest pain that may be cardiac. She is at high risk for heart disease. I don't want her to have a heart attack and die. If they can't do the stress test then she needs referral to cardiology.

## 2018-12-08 NOTE — Telephone Encounter (Signed)
Pt's appointment for ETT is scheduled 01/10/19 because of COVID 19.Let me know if you feel patient needs to be scheduled before then,Thanks

## 2018-12-08 NOTE — Telephone Encounter (Signed)
Please review

## 2018-12-12 ENCOUNTER — Ambulatory Visit
Admission: RE | Admit: 2018-12-12 | Discharge: 2018-12-12 | Disposition: A | Discharge status: Home / Self Care | Payer: PRIVATE HEALTH INSURANCE | Source: Ambulatory Visit | Attending: Family Medicine | Admitting: Family Medicine

## 2018-12-12 ENCOUNTER — Telehealth: Payer: Self-pay

## 2018-12-12 ENCOUNTER — Other Ambulatory Visit: Payer: Self-pay

## 2018-12-12 DIAGNOSIS — R079 Chest pain, unspecified: Secondary | ICD-10-CM | POA: Insufficient documentation

## 2018-12-12 LAB — EXERCISE TOLERANCE TEST
CHL CUP RESTING HR STRESS: 78 {beats}/min
CSEPHR: 93 %
Estimated workload: 7.2 METS
Exercise duration (min): 6 min
Exercise duration (sec): 9 s
MPHR: 156 {beats}/min
Peak HR: 146 {beats}/min

## 2018-12-12 NOTE — Telephone Encounter (Signed)
-----   Message from Malva Limes, MD sent at 12/12/2018  1:23 PM EDT ----- EKG stress test is normal. If chest pains have resolved then no further testing needed. If still having chest pains then would recommend referral to cardiology.

## 2018-12-12 NOTE — Telephone Encounter (Signed)
LMTCB 12/12/2018   Thanks,   -Caroline Longie  

## 2018-12-13 NOTE — Telephone Encounter (Signed)
Patient advised. She states she has not had chest pain the past 2 days. She will call back if she experiences recurrent chest pain to request Cardio referral.

## 2018-12-19 ENCOUNTER — Encounter: Payer: Self-pay | Admitting: Family Medicine

## 2019-01-10 ENCOUNTER — Ambulatory Visit: Payer: BLUE CROSS/BLUE SHIELD

## 2019-09-18 DIAGNOSIS — K2970 Gastritis, unspecified, without bleeding: Secondary | ICD-10-CM | POA: Insufficient documentation

## 2019-09-18 DIAGNOSIS — M25539 Pain in unspecified wrist: Secondary | ICD-10-CM | POA: Insufficient documentation

## 2019-09-18 DIAGNOSIS — M549 Dorsalgia, unspecified: Secondary | ICD-10-CM | POA: Insufficient documentation

## 2019-09-18 DIAGNOSIS — E8941 Symptomatic postprocedural ovarian failure: Secondary | ICD-10-CM | POA: Insufficient documentation

## 2019-09-18 DIAGNOSIS — R5383 Other fatigue: Secondary | ICD-10-CM | POA: Insufficient documentation

## 2019-09-18 DIAGNOSIS — G459 Transient cerebral ischemic attack, unspecified: Secondary | ICD-10-CM | POA: Insufficient documentation

## 2019-09-18 DIAGNOSIS — R7303 Prediabetes: Secondary | ICD-10-CM | POA: Insufficient documentation

## 2019-09-18 DIAGNOSIS — N644 Mastodynia: Secondary | ICD-10-CM | POA: Insufficient documentation

## 2019-09-18 DIAGNOSIS — Z8249 Family history of ischemic heart disease and other diseases of the circulatory system: Secondary | ICD-10-CM | POA: Insufficient documentation

## 2019-09-18 DIAGNOSIS — R109 Unspecified abdominal pain: Secondary | ICD-10-CM | POA: Insufficient documentation

## 2019-09-18 DIAGNOSIS — G4700 Insomnia, unspecified: Secondary | ICD-10-CM | POA: Insufficient documentation

## 2019-09-18 DIAGNOSIS — Z0000 Encounter for general adult medical examination without abnormal findings: Secondary | ICD-10-CM | POA: Insufficient documentation

## 2019-09-18 DIAGNOSIS — K222 Esophageal obstruction: Secondary | ICD-10-CM | POA: Insufficient documentation

## 2019-09-18 DIAGNOSIS — R1013 Epigastric pain: Secondary | ICD-10-CM | POA: Insufficient documentation

## 2019-09-18 DIAGNOSIS — Z8719 Personal history of other diseases of the digestive system: Secondary | ICD-10-CM | POA: Insufficient documentation

## 2019-09-19 ENCOUNTER — Ambulatory Visit
Admission: RE | Admit: 2019-09-19 | Discharge: 2019-09-19 | Disposition: A | Discharge status: Home / Self Care | Payer: Medicare Other | Source: Ambulatory Visit | Attending: Family Medicine | Admitting: Family Medicine

## 2019-09-19 ENCOUNTER — Other Ambulatory Visit: Payer: Self-pay

## 2019-09-19 ENCOUNTER — Encounter: Payer: Self-pay | Admitting: Family Medicine

## 2019-09-19 ENCOUNTER — Ambulatory Visit (INDEPENDENT_AMBULATORY_CARE_PROVIDER_SITE_OTHER): Payer: Medicare Other | Admitting: Family Medicine

## 2019-09-19 VITALS — BP 120/73 | HR 86 | Temp 96.8°F | Ht 60.0 in | Wt 132.2 lb

## 2019-09-19 DIAGNOSIS — E785 Hyperlipidemia, unspecified: Secondary | ICD-10-CM

## 2019-09-19 DIAGNOSIS — Z1231 Encounter for screening mammogram for malignant neoplasm of breast: Secondary | ICD-10-CM | POA: Diagnosis not present

## 2019-09-19 DIAGNOSIS — R591 Generalized enlarged lymph nodes: Secondary | ICD-10-CM | POA: Diagnosis not present

## 2019-09-19 NOTE — Progress Notes (Signed)
Patient: Erika Smith Female    DOB: [birth year]   63 y.o.   MRN: [id number] Visit Date: 09/19/2019  Today's Provider: Wilhemena Durie, MD   Chief Complaint  Patient presents with  . Swollen lymph nodes   Subjective:     HPI Patient presents today C/O swollen lymph nodes underarms near breast area. She noticed the swelling 3 weeks agoPatient denies heat and redness. She states under the right arm is worse than the left arm. Patient reports area is tender and sore.  No fevers or systemic symptoms.  Allergies  Allergen Reactions  . Codeine   . Morphine   . Percocet  [Oxycodone-Acetaminophen]   . Tramadol Hcl      Current Outpatient Medications:  Marland Kitchen  Multiple Vitamins-Minerals (MULTIVITAMIN ADULT PO), Take 1 tablet by mouth daily., Disp: , Rfl:   Review of Systems  Constitutional: Negative.   HENT: Negative.   Eyes: Negative.   Respiratory: Negative.   Cardiovascular: Negative.   Endocrine: Negative.   Musculoskeletal: Negative.   Allergic/Immunologic: Negative.   Hematological: Negative.   Psychiatric/Behavioral: Negative.     Social History   Tobacco Use  . Smoking status: Former Smoker    Packs/day: 0.75    Years: 32.00    Pack years: 24.00    Start date: 1976    Quit date: 2008    Years since quitting: 13.0  . Smokeless tobacco: Never Used  Substance Use Topics  . Alcohol use: No      Objective:   BP 120/73 (BP Location: Right Arm, Patient Position: Sitting, Cuff Size: Normal)   Pulse 86   Temp (!) 96.8 F (36 C) (Temporal)   Ht 5' (1.524 m)   Wt 132 lb 3.2 oz (60 kg)   BMI 25.82 kg/m  Vitals:   09/19/19 1039  BP: 120/73  Pulse: 86  Temp: (!) 96.8 F (36 C)  TempSrc: Temporal  Weight: 132 lb 3.2 oz (60 kg)  Height: 5' (1.524 m)  Body mass index is 25.82 kg/m.   Physical Exam Vitals reviewed.  Constitutional:      Appearance: Normal appearance.  HENT:     Head: Normocephalic and atraumatic.     Right Ear: External  ear normal.     Left Ear: External ear normal.  Eyes:     General: No scleral icterus.    Conjunctiva/sclera: Conjunctivae normal.  Cardiovascular:     Rate and Rhythm: Normal rate and regular rhythm.     Heart sounds: Normal heart sounds.  Pulmonary:     Breath sounds: Normal breath sounds.  Musculoskeletal:        General: No swelling.  Lymphadenopathy:     Cervical: No cervical adenopathy.  Skin:    General: Skin is warm and dry.  Neurological:     General: No focal deficit present.     Mental Status: She is alert and oriented to person, place, and time.  Psychiatric:        Mood and Affect: Mood normal.        Behavior: Behavior normal.        Thought Content: Thought content normal.        Judgment: Judgment normal.      No results found for any visits on 09/19/19.     Assessment & Plan    1. Lymphadenopathy No lymphadenopathy on exam today.  Obtain chest x-ray and labs and refer back to Dr. Caryn Section in the next  couple of weeks. - CBC with Diff - Comp Met (CMET) - Lipid panel - TSH - DG Chest 2 View - MM Digital Diagnostic Bilat  2. Hyperlipidemia, unspecified hyperlipidemia type  - Lipid panel  3. Screening mammogram, encounter for  - MM Digital Diagnostic Bilat     Wilhemena Durie, MD  [facility]

## 2019-09-19 NOTE — Patient Instructions (Addendum)
1. Lymphadenopathy  - CBC with Diff - Comp Met (CMET) - Lipid panel - TSH - DG Chest 2 View - MM Digital Diagnostic Bilat  2. Hyperlipidemia, unspecified hyperlipidemia type  - Lipid panel  3. Screening mammogram, encounter for  - MM Digital Diagnostic Bilat

## 2019-09-20 LAB — CBC WITH DIFFERENTIAL/PLATELET
Basophils Absolute: 0.1 10*3/uL (ref 0.0–0.2)
Basos: 1 %
EOS (ABSOLUTE): 0.1 10*3/uL (ref 0.0–0.4)
Eos: 3 %
Hematocrit: 38.5 % (ref 34.0–46.6)
Hemoglobin: 13.2 g/dL (ref 11.1–15.9)
Immature Grans (Abs): 0 10*3/uL (ref 0.0–0.1)
Immature Granulocytes: 0 %
Lymphocytes Absolute: 2 10*3/uL (ref 0.7–3.1)
Lymphs: 42 %
MCH: 32.5 pg (ref 26.6–33.0)
MCHC: 34.3 g/dL (ref 31.5–35.7)
MCV: 95 fL (ref 79–97)
Monocytes Absolute: 0.5 10*3/uL (ref 0.1–0.9)
Monocytes: 10 %
Neutrophils Absolute: 2.1 10*3/uL (ref 1.4–7.0)
Neutrophils: 44 %
Platelets: 250 10*3/uL (ref 150–450)
RBC: 4.06 x10E6/uL (ref 3.77–5.28)
RDW: 11.7 % (ref 11.7–15.4)
WBC: 4.7 10*3/uL (ref 3.4–10.8)

## 2019-09-20 LAB — TSH: TSH: 3.28 u[IU]/mL (ref 0.450–4.500)

## 2019-09-20 LAB — COMPREHENSIVE METABOLIC PANEL
ALT: 16 IU/L (ref 0–32)
AST: 20 IU/L (ref 0–40)
Albumin/Globulin Ratio: 1.9 (ref 1.2–2.2)
Albumin: 4.6 g/dL (ref 3.8–4.8)
Alkaline Phosphatase: 98 IU/L (ref 39–117)
BUN/Creatinine Ratio: 32 — ABNORMAL HIGH (ref 12–28)
BUN: 22 mg/dL (ref 8–27)
Bilirubin Total: 0.3 mg/dL (ref 0.0–1.2)
CO2: 23 mmol/L (ref 20–29)
Calcium: 9.7 mg/dL (ref 8.7–10.3)
Chloride: 102 mmol/L (ref 96–106)
Creatinine, Ser: 0.68 mg/dL (ref 0.57–1.00)
GFR calc Af Amer: 106 mL/min/{1.73_m2} (ref >59)
GFR calc non Af Amer: 92 mL/min/{1.73_m2} (ref >59)
Globulin, Total: 2.4 g/dL (ref 1.5–4.5)
Glucose: 111 mg/dL — ABNORMAL HIGH (ref 65–99)
Potassium: 4.2 mmol/L (ref 3.5–5.2)
Sodium: 138 mmol/L (ref 134–144)
Total Protein: 7 g/dL (ref 6.0–8.5)

## 2019-09-20 LAB — LIPID PANEL
Chol/HDL Ratio: 2.8 ratio (ref 0.0–4.4)
Cholesterol, Total: 244 mg/dL — ABNORMAL HIGH (ref 100–199)
HDL: 86 mg/dL (ref >39)
LDL Chol Calc (NIH): 148 mg/dL — ABNORMAL HIGH (ref 0–99)
Triglycerides: 61 mg/dL (ref 0–149)
VLDL Cholesterol Cal: 10 mg/dL (ref 5–40)

## 2019-10-03 ENCOUNTER — Encounter: Payer: Self-pay | Admitting: Family Medicine

## 2019-10-16 ENCOUNTER — Ambulatory Visit: Payer: Self-pay | Admitting: Family Medicine

## 2020-09-03 ENCOUNTER — Telehealth (INDEPENDENT_AMBULATORY_CARE_PROVIDER_SITE_OTHER): Payer: Medicare Other | Admitting: Physician Assistant

## 2020-09-03 DIAGNOSIS — R059 Cough, unspecified: Secondary | ICD-10-CM | POA: Diagnosis not present

## 2020-09-03 NOTE — Progress Notes (Signed)
MyChart Video Visit    Virtual Visit via Video Note   This visit type was conducted due to national recommendations for restrictions regarding the COVID-19 Pandemic (e.g. social distancing) in an effort to limit this patient's exposure and mitigate transmission in our community. This patient is at least at moderate risk for complications without adequate follow up. This format is felt to be most appropriate for this patient at this time. Physical exam was limited by quality of the video and audio technology used for the visit.   Patient location: Home Provider location: Office   I discussed the limitations of evaluation and management by telemedicine and the availability of in person appointments. The patient expressed understanding and agreed to proceed.  Patient: Erika Smith   DOB: 10/05/1955   64 y.o. Female  MRN: [id number] Visit Date: 09/03/2020  Today's healthcare provider: Trey Sailors, PA-C   Chief Complaint  Patient presents with  . Cough  I,Amerigo Mcglory M Jamonte Curfman,acting as a scribe for Trey Sailors, PA-C.,have documented all relevant documentation on the behalf of Trey Sailors, PA-C,as directed by  Trey Sailors, PA-C while in the presence of Trey Sailors, PA-C.  Subjective    Cough This is a new problem. The current episode started more than 1 month ago. The problem has been unchanged. The problem occurs every few hours. The cough is productive of sputum. Pertinent negatives include no chills, ear congestion, ear pain, fever, headaches, postnasal drip, rhinorrhea, sore throat, shortness of breath or wheezing. Exacerbated by: eating. She has tried OTC cough suppressant for the symptoms. The treatment provided no relief. Her past medical history is significant for pneumonia. There is no history of asthma, bronchiectasis, bronchitis, COPD, emphysema or environmental allergies.  Patient is concerned due to sister passing away 2 years ago from lung cancer and  she had the same cough. Patient reports she had a cough ongoing for two months, she notices a cough increases after eating. She denies fevers, chills, illness symptoms.   She was diagnosed with obstructive pulmonary disease in 2012 when in the hospital but ultimately followed up with pulmonology and reported she did not have this upon retesting. She is concerned about lung cancer.     Medications: Outpatient Medications Prior to Visit  Medication Sig  . Multiple Vitamins-Minerals (MULTIVITAMIN ADULT PO) Take 1 tablet by mouth daily.   No facility-administered medications prior to visit.    Review of Systems  Constitutional: Negative for chills, fatigue and fever.  HENT: Negative for congestion, ear pain, postnasal drip, rhinorrhea, sinus pressure, sinus pain, sneezing and sore throat.   Respiratory: Positive for cough. Negative for chest tightness, shortness of breath and wheezing.   Allergic/Immunologic: Negative for environmental allergies.  Neurological: Negative for headaches.      Objective    There were no vitals taken for this visit.   Physical Exam Constitutional:      Appearance: Normal appearance.  Pulmonary:     Effort: Pulmonary effort is normal. No respiratory distress.  Neurological:     Mental Status: She is alert.  Psychiatric:        Mood and Affect: Mood normal.        Behavior: Behavior normal.        Assessment & Plan    1. Cough  Repeat CXR, last CXR 08/2019 normal. Counseled on chronic causes of cough including asthma/COPD, GERD and PND. Suggest she try 20 mg pepcid 30 min before meal on an empty  stomach.   - DG Chest 2 View; Future   No follow-ups on file.     I discussed the assessment and treatment plan with the patient. The patient was provided an opportunity to ask questions and all were answered. The patient agreed with the plan and demonstrated an understanding of the instructions.   The patient was advised to call back or seek an  in-person evaluation if the symptoms worsen or if the condition fails to improve as anticipated.   ITrey Sailors, PA-C, have reviewed all documentation for this visit. The documentation on 09/03/20 for the exam, diagnosis, procedures, and orders are all accurate and complete.  The entirety of the information documented in the History of Present Illness, Review of Systems and Physical Exam were personally obtained by me. Portions of this information were initially documented by [facility] and reviewed by me for thoroughness and accuracy.    Maryella Shivers [facility] [phone] (phone) [phone] (fax)  [facility]

## 2020-09-04 ENCOUNTER — Other Ambulatory Visit: Payer: Self-pay

## 2020-09-04 ENCOUNTER — Ambulatory Visit
Admission: RE | Admit: 2020-09-04 | Discharge: 2020-09-04 | Disposition: A | Discharge status: Home / Self Care | Payer: Medicare Other | Source: Ambulatory Visit | Attending: Physician Assistant | Admitting: Physician Assistant

## 2020-09-04 ENCOUNTER — Ambulatory Visit
Admission: RE | Admit: 2020-09-04 | Discharge: 2020-09-04 | Disposition: A | Discharge status: Home / Self Care | Payer: Medicare Other | Attending: Physician Assistant | Admitting: Physician Assistant

## 2020-09-04 DIAGNOSIS — R059 Cough, unspecified: Secondary | ICD-10-CM | POA: Insufficient documentation

## 2020-09-04 DIAGNOSIS — J90 Pleural effusion, not elsewhere classified: Secondary | ICD-10-CM | POA: Diagnosis not present

## 2020-09-30 NOTE — Progress Notes (Signed)
Subjective:   Erika Smith is a 65 y.o. female who presents for an Initial Medicare Annual Wellness Visit.  I connected with Erika OhmPatricia Smith today by telephone and verified that I am speaking with the correct person using two identifiers. Location patient: home Location provider: work Persons participating in the virtual visit: patient, provider.   I discussed the limitations, risks, security and privacy concerns of performing an evaluation and management service by telephone and the availability of in person appointments. I also discussed with the patient that there may be a patient responsible charge related to this service. The patient expressed understanding and verbally consented to this telephonic visit.    Interactive audio and video telecommunications were attempted between this provider and patient, however failed, due to patient having technical difficulties OR patient did not have access to video capability.  We continued and completed visit with audio only.   Review of Systems    N/A  Cardiac Risk Factors include: advanced age (>5955men, 52>65 women)     Objective:    There were no vitals filed for this visit. There is no height or weight on file to calculate BMI.  Advanced Directives 10/01/2020  Does Patient Have a Medical Advance Directive? No  Does patient want to make changes to medical advance directive? No - Patient declined    Current Medications (verified) Outpatient Encounter Medications as of 10/01/2020  Medication Sig  . Multiple Vitamins-Minerals (MULTIVITAMIN ADULT PO) Take 1 tablet by mouth daily.   No facility-administered encounter medications on file as of 10/01/2020.    Allergies (verified) Codeine, Morphine, Percocet  [oxycodone-acetaminophen], and Tramadol hcl   History: History reviewed. No pertinent past medical history. Past Surgical History:  Procedure Laterality Date  . ABDOMINAL HYSTERECTOMY  1982   BSO  . APPENDECTOMY  1982  .  CARDIAC CATHETERIZATION     11/2007  . CHOLECYSTECTOMY  2010  . MR Brain, Brain Stem  05/09/2007    Normal MRI.  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 UPPER GI ENDOSCOPY  02/02/2010   Dr. Bluford Kaufmannh. LA Grade A reflux esophagitis.   Family History  Problem Relation Age of Onset  . Stroke Mother   . Diabetes Mother   . Diabetes Mellitus II Father   . Stroke Father   . Heart disease Father   . Congestive Heart Failure Father   . Allergies Sister   . Asthma Sister   . Diabetes Brother   . Heart disease Brother   . Leukemia Other   . Heart disease Sister        Had triple bypass  . Cancer - Other Sister        Cancer   Social History   Socioeconomic History  . Marital status: Married    Spouse name: Not on file  . Number of children: 2  . Years of education: Not on file  . Highest education level: Associate degree: occupational, Scientist, product/process developmenttechnical, or vocational program  Occupational History  . 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 Smoking status: Former Smoker    Packs/day: 0.75    Years: 32.00    Pack years: 24.00    Start date: 1976    Quit date: 2008    Years since quitting: 14.0  . Smokeless tobacco: Never Used  Vaping Use  . Vaping Use: Former  . Devices: E cigarette - quit  in 2008  Substance and Sexual Activity  . Alcohol use: No  . Drug use: No  . Sexual activity: Not on file  Other Topics Concern  . Not on file  Social History Narrative  . Not on file   Social Determinants of Health   Financial Resource Strain: Low Risk   . Difficulty of Paying Living Expenses: Not hard at all  Food Insecurity: No Food Insecurity  . Worried About Programme researcher, broadcasting/film/video in the Last Year: Never true  . Ran Out of Food in the Last Year: Never true  Transportation Needs: No Transportation Needs  . Lack of Transportation (Medical): No  . Lack of Transportation (Non-Medical): No  Physical  Activity: Inactive  . Days of Exercise per Week: 0 days  . Minutes of Exercise per Session: 0 min  Stress: No Stress Concern Present  . Feeling of Stress : Not at all  Social Connections: Socially Integrated  . Frequency of Communication with Friends and Family: More than three times a week  . Frequency of Social Gatherings with Friends and Family: More than three times a week  . Attends Religious Services: More than 4 times per year  . Active Member of Clubs or Organizations: Yes  . Attends Banker Meetings: Never  . Marital Status: Married    Tobacco Counseling Counseling given: Not Answered   Clinical Intake:  Pre-visit preparation completed: Yes  Pain : No/denies pain     Nutritional Risks: None Diabetes:  (Prediabetic)  How often do you need to have someone help you when you read instructions, pamphlets, or other written materials from your doctor or pharmacy?: 1 - Never  Diabetic? Prediabetic  Interpreter Needed?: No  Information entered by :: [facility], LPN   Activities of Daily Living In your present state of health, do you have any difficulty performing the following activities: 10/01/2020  Hearing? N  Vision? Y  Comment Needs a new eye glass prescription.  Difficulty concentrating or making decisions? N  Walking or climbing stairs? N  Dressing or bathing? N  Doing errands, shopping? N  Preparing Food and eating ? N  Using the Toilet? N  In the past six months, have you accidently leaked urine? Y  Comment Occasionally with pressure. Wears protection most of the day.  Do you have problems with loss of bowel control? N  Managing your Medications? N  Managing your Finances? N  Housekeeping or managing your Housekeeping? N  Some recent data might be hidden    Patient Care Team: Malva Limes, MD as PCP - General (Family Medicine) [facility], [facility] (Optometry)  Indicate any recent Medical Services you may have received from other  than [facility] providers in the past year (date may be approximate).     Assessment:   This is a routine wellness examination for Erika Smith.  Hearing/Vision screen No exam data present  Dietary issues and exercise activities discussed: Current Exercise Habits: The patient does not participate in regular exercise at present, Exercise limited by: None identified  Goals    . DIET - REDUCE CALORIE INTAKE     Recommend to cut back on all carbohydrates in daily diet.       Depression Screen PHQ 2/9 Scores 10/01/2020 09/19/2019 02/28/2018  PHQ - 2 Score 0 0 0  PHQ- 9 Score - - 3    Fall Risk Fall Risk  10/01/2020 09/19/2019  Falls in the past year? 1 1  Number falls in past yr:  1 0  Injury with Fall? 0 0  Follow up Falls prevention discussed -    FALL RISK PREVENTION PERTAINING TO THE HOME:  Any stairs in or around the home? No  If so, are there any without handrails? No  Home free of loose throw rugs in walkways, pet beds, electrical cords, etc? Yes  Adequate lighting in your home to reduce risk of falls? Yes   ASSISTIVE DEVICES UTILIZED TO PREVENT FALLS:  Life alert? No    Use of a cane, walker or w/c? No  Grab bars in the bathroom? No  Shower chair or bench in shower? Yes  Elevated toilet seat or a handicapped toilet? No    Cognitive Function:      6CIT Screen 10/01/2020  What Year? 0 points  What month? 0 points  What time? 0 points  Count back from 20 0 points  Months in reverse 0 points  Repeat phrase 0 points  Total Score 0    Immunizations Immunization History  Administered Date(s) Administered  . Influenza Split 06/07/2009  . Pneumococcal Polysaccharide-23 07/19/2007  . Td 04/29/2005    TDAP status: Due, Education has been provided regarding the importance of this vaccine. Advised may receive this vaccine at local pharmacy or Health Dept. Aware to provide a copy of the vaccination record if obtained from local pharmacy or Health Dept. Verbalized acceptance  and understanding.  Flu Vaccine status: Declined, Education has been provided regarding the importance of this vaccine but patient still declined. Advised may receive this vaccine at local pharmacy or Health Dept. Aware to provide a copy of the vaccination record if obtained from local pharmacy or Health Dept. Verbalized acceptance and understanding.  Pneumococcal vaccine status: Due, Education has been provided regarding the importance of this vaccine. Advised may receive this vaccine at local pharmacy or Health Dept. Aware to provide a copy of the vaccination record if obtained from local pharmacy or Health Dept. Verbalized acceptance and understanding.  Covid-19 vaccine status: Declined, Education has been provided regarding the importance of this vaccine but patient still declined. Advised may receive this vaccine at local pharmacy or Health Dept.or vaccine clinic. Aware to provide a copy of the vaccination record if obtained from local pharmacy or Health Dept. Verbalized acceptance and understanding.  Qualifies for Shingles Vaccine? Yes   Zostavax completed No   Shingrix Completed?: No.    Education has been provided regarding the importance of this vaccine. Patient has been advised to call insurance company to determine out of pocket expense if they have not yet received this vaccine. Advised may also receive vaccine at local pharmacy or Health Dept. Verbalized acceptance and understanding.  Screening Tests Health Maintenance  Topic Date Due  . Hepatitis C Screening  Never done  . MAMMOGRAM  01/16/2017  . COLONOSCOPY (Pts 45-38yrs Insurance coverage will need to be confirmed)  03/01/2019  . DEXA SCAN  Never done  . PNA vac Low Risk Adult (1 of 2 - PCV13) 08/19/2019  . COVID-19 Vaccine (1) 10/17/2020 (Originally 08/18/1968)  . INFLUENZA VACCINE  12/18/2020 (Originally 04/20/2020)  . TETANUS/TDAP  10/01/2021 (Originally 04/30/2015)    Health Maintenance  Health Maintenance Due  Topic  Date Due  . Hepatitis C Screening  Never done  . MAMMOGRAM  01/16/2017  . COLONOSCOPY (Pts 45-45yrs Insurance coverage will need to be confirmed)  03/01/2019  . DEXA SCAN  Never done  . PNA vac Low Risk Adult (1 of 2 - PCV13) 08/19/2019  Colorectal cancer screening: Referral to GI placed today. Pt aware the office will call re: appt.  Mammogram status: Ordered today. Pt provided with contact info and advised to call to schedule appt.   Bone Density status: Ordered today. Pt provided with contact info and advised to call to schedule appt.  Lung Cancer Screening: (Low Dose CT Chest recommended if Age 35-80 years, 30 pack-year currently smoking OR have quit w/in 15years.) does qualify.   Lung Cancer Screening Referral: An Epic message has been sent to Glenna Fellows, RN (Oncology Nurse Navigator) regarding the possible need for this exam. Ines Bloomer will review the patient's chart to determine if the patient truly qualifies for the exam. If the patient qualifies, Ines Bloomer will order the Low Dose CT of the chest to facilitate the scheduling of this exam.  Additional Screening:  Hepatitis C Screening: does qualify and would like this added to next in office blood work orders.  Vision Screening: Recommended annual ophthalmology exams for early detection of glaucoma and other disorders of the eye. Is the patient up to date with their annual eye exam?  Yes  Who is the provider or what is the name of the office in which the patient attends annual eye exams? [facility] If pt is not established with a provider, would they like to be referred to a provider to establish care? No .   Dental Screening: Recommended annual dental exams for proper oral hygiene  Community Resource Referral / Chronic Care Management: CRR required this visit?  No   CCM required this visit?  No      Plan:     I have personally reviewed and noted the following in the patient's chart:   . Medical and social history . Use of  alcohol, tobacco or illicit drugs  . Current medications and supplements . Functional ability and status . Nutritional status . Physical activity . Advanced directives . List of other physicians . Hospitalizations, surgeries, and ER visits in previous 12 months . Vitals . Screenings to include cognitive, depression, and falls . Referrals and appointments  In addition, I have reviewed and discussed with patient certain preventive protocols, quality metrics, and best practice recommendations. A written personalized care plan for preventive services as well as general preventive health recommendations were provided to patient.     Shann Merrick [place], [place]   03/19/1600   Nurse Notes: Pt would like the Hep C lab order and a Prevnar 13 vaccine at next in office apt. Pt declined receiving a Covid or flu vaccine. Orders placed for a colonoscopy, mammogram and DEXA scan.

## 2020-10-01 ENCOUNTER — Ambulatory Visit (INDEPENDENT_AMBULATORY_CARE_PROVIDER_SITE_OTHER): Payer: Medicare HMO

## 2020-10-01 ENCOUNTER — Other Ambulatory Visit: Payer: Self-pay

## 2020-10-01 DIAGNOSIS — Z1231 Encounter for screening mammogram for malignant neoplasm of breast: Secondary | ICD-10-CM

## 2020-10-01 DIAGNOSIS — Z1211 Encounter for screening for malignant neoplasm of colon: Secondary | ICD-10-CM

## 2020-10-01 DIAGNOSIS — E2839 Other primary ovarian failure: Secondary | ICD-10-CM

## 2020-10-01 DIAGNOSIS — Z0000 Encounter for general adult medical examination without abnormal findings: Secondary | ICD-10-CM | POA: Diagnosis not present

## 2020-10-01 NOTE — Patient Instructions (Signed)
Erika Smith , Thank you for taking time to come for your Medicare Wellness Visit. I appreciate your ongoing commitment to your health goals. Please review the following plan we discussed and let me know if I can assist you in the future.   Screening recommendations/referrals: Colonoscopy: Referral placed today. Pt aware office will contact her AU:QJFH. Mammogram: Ordered today. Please call [phone] to schedule your mammogram.  Bone Density: Ordered today. Pt aware office will contact her HT:DSKA. Recommended yearly ophthalmology/optometry visit for glaucoma screening and checkup Recommended yearly dental visit for hygiene and checkup  Vaccinations: Influenza vaccine: Currently due, declined receiving.  Pneumococcal vaccine: Prevnar 13 due. Will receive at next in office apt.  Tdap vaccine: Currently due, declined receiving.  Shingles vaccine: Shingrix discussed. Please contact your pharmacy for coverage information.     Advanced directives: Advance directive discussed with you today. Even though you declined this today please call our office should you change your mind and we can give you the proper paperwork for you to fill out.  Conditions/risks identified: Recommend to cut back on all carbohydrates in daily diet.   Next appointment: 10/06/21 @ 9:00 AM for an AWV. Declined scheduling a follow up with PCP at this time.    Preventive Care 35 Years and Older, Female Preventive care refers to lifestyle choices and visits with your health care provider that can promote health and wellness. What does preventive care include?  A yearly physical exam. This is also called an annual well check.  Dental exams once or twice a year.  Routine eye exams. Ask your health care provider how often you should have your eyes checked.  Personal lifestyle choices, including:  Daily care of your teeth and gums.  Regular physical activity.  Eating a healthy diet.  Avoiding tobacco and drug  use.  Limiting alcohol use.  Practicing safe sex.  Taking low-dose aspirin every day.  Taking vitamin and mineral supplements as recommended by your health care provider. What happens during an annual well check? The services and screenings done by your health care provider during your annual well check will depend on your age, overall health, lifestyle risk factors, and family history of disease. Counseling  Your health care provider may ask you questions about your:  Alcohol use.  Tobacco use.  Drug use.  Emotional well-being.  Home and relationship well-being.  Sexual activity.  Eating habits.  History of falls.  Memory and ability to understand (cognition).  Work and work Astronomer.  Reproductive health. Screening  You may have the following tests or measurements:  Height, weight, and BMI.  Blood pressure.  Lipid and cholesterol levels. These may be checked every 5 years, or more frequently if you are over 21 years old.  Skin check.  Lung cancer screening. You may have this screening every year starting at age 79 if you have a 30-pack-year history of smoking and currently smoke or have quit within the past 15 years.  Fecal occult blood test (FOBT) of the stool. You may have this test every year starting at age 1.  Flexible sigmoidoscopy or colonoscopy. You may have a sigmoidoscopy every 5 years or a colonoscopy every 10 years starting at age 80.  Hepatitis C blood test.  Hepatitis B blood test.  Sexually transmitted disease (STD) testing.  Diabetes screening. This is done by checking your blood sugar (glucose) after you have not eaten for a while (fasting). You may have this done every 1-3 years.  Bone density scan. This  is done to screen for osteoporosis. You may have this done starting at age 4.  Mammogram. This may be done every 1-2 years. Talk to your health care provider about how often you should have regular mammograms. Talk with your  health care provider about your test results, treatment options, and if necessary, the need for more tests. Vaccines  Your health care provider may recommend certain vaccines, such as:  Influenza vaccine. This is recommended every year.  Tetanus, diphtheria, and acellular pertussis (Tdap, Td) vaccine. You may need a Td booster every 10 years.  Zoster vaccine. You may need this after age 86.  Pneumococcal 13-valent conjugate (PCV13) vaccine. One dose is recommended after age 44.  Pneumococcal polysaccharide (PPSV23) vaccine. One dose is recommended after age 64. Talk to your health care provider about which screenings and vaccines you need and how often you need them. This information is not intended to replace advice given to you by your health care provider. Make sure you discuss any questions you have with your health care provider. Document Released: 10/03/2015 Document Revised: 05/26/2016 Document Reviewed: 07/08/2015 Elsevier Interactive Patient Education  2017 [place] Prevention in the Home Falls can cause injuries. They can happen to people of all ages. There are many things you can do to make your home safe and to help prevent falls. What can I do on the outside of my home?  Regularly fix the edges of walkways and driveways and fix any cracks.  Remove anything that might make you trip as you walk through a door, such as a raised step or threshold.  Trim any bushes or trees on the path to your home.  Use bright outdoor lighting.  Clear any walking paths of anything that might make someone trip, such as rocks or tools.  Regularly check to see if handrails are loose or broken. Make sure that both sides of any steps have handrails.  Any raised decks and porches should have guardrails on the edges.  Have any leaves, snow, or ice cleared regularly.  Use sand or salt on walking paths during winter.  Clean up any spills in your garage right away. This includes oil  or grease spills. What can I do in the bathroom?  Use night lights.  Install grab bars by the toilet and in the tub and shower. Do not use towel bars as grab bars.  Use non-skid mats or decals in the tub or shower.  If you need to sit down in the shower, use a plastic, non-slip stool.  Keep the floor dry. Clean up any water that spills on the floor as soon as it happens.  Remove soap buildup in the tub or shower regularly.  Attach bath mats securely with double-sided non-slip rug tape.  Do not have throw rugs and other things on the floor that can make you trip. What can I do in the bedroom?  Use night lights.  Make sure that you have a light by your bed that is easy to reach.  Do not use any sheets or blankets that are too big for your bed. They should not hang down onto the floor.  Have a firm chair that has side arms. You can use this for support while you get dressed.  Do not have throw rugs and other things on the floor that can make you trip. What can I do in the kitchen?  Clean up any spills right away.  Avoid walking on wet floors.  Keep items that you use a lot in easy-to-reach places.  If you need to reach something above you, use a strong step stool that has a grab bar.  Keep electrical cords out of the way.  Do not use floor polish or wax that makes floors slippery. If you must use wax, use non-skid floor wax.  Do not have throw rugs and other things on the floor that can make you trip. What can I do with my stairs?  Do not leave any items on the stairs.  Make sure that there are handrails on both sides of the stairs and use them. Fix handrails that are broken or loose. Make sure that handrails are as long as the stairways.  Check any carpeting to make sure that it is firmly attached to the stairs. Fix any carpet that is loose or worn.  Avoid having throw rugs at the top or bottom of the stairs. If you do have throw rugs, attach them to the floor with  carpet tape.  Make sure that you have a light switch at the top of the stairs and the bottom of the stairs. If you do not have them, ask someone to add them for you. What else can I do to help prevent falls?  Wear shoes that:  Do not have high heels.  Have rubber bottoms.  Are comfortable and fit you well.  Are closed at the toe. Do not wear sandals.  If you use a stepladder:  Make sure that it is fully opened. Do not climb a closed stepladder.  Make sure that both sides of the stepladder are locked into place.  Ask someone to hold it for you, if possible.  Clearly mark and make sure that you can see:  Any grab bars or handrails.  First and last steps.  Where the edge of each step is.  Use tools that help you move around (mobility aids) if they are needed. These include:  Canes.  Walkers.  Scooters.  Crutches.  Turn on the lights when you go into a dark area. Replace any light bulbs as soon as they burn out.  Set up your furniture so you have a clear path. Avoid moving your furniture around.  If any of your floors are uneven, fix them.  If there are any pets around you, be aware of where they are.  Review your medicines with your doctor. Some medicines can make you feel dizzy. This can increase your chance of falling. Ask your doctor what other things that you can do to help prevent falls. This information is not intended to replace advice given to you by your health care provider. Make sure you discuss any questions you have with your health care provider. Document Released: 07/03/2009 Document Revised: 02/12/2016 Document Reviewed: 10/11/2014 Elsevier Interactive Patient Education  2017 [employer].

## 2020-10-10 ENCOUNTER — Telehealth: Payer: Self-pay | Admitting: *Deleted

## 2020-10-10 DIAGNOSIS — Z122 Encounter for screening for malignant neoplasm of respiratory organs: Secondary | ICD-10-CM

## 2020-10-10 DIAGNOSIS — Z87891 Personal history of nicotine dependence: Secondary | ICD-10-CM

## 2020-10-10 NOTE — Telephone Encounter (Signed)
Received referral for initial lung cancer screening scan. Contacted patient and obtained smoking history,(former, quit 2008, 33 pack year) as well as answering questions related to screening process. Patient denies signs of lung cancer such as weight loss or hemoptysis. Patient denies comorbidity that would prevent curative treatment if lung cancer were found. Patient is scheduled for shared decision making visit and CT scan on 10/21/20 at 145pm.

## 2020-10-10 NOTE — Telephone Encounter (Signed)
Received referral for low dose lung cancer screening CT scan. Message left at phone number listed in EMR for patient to call me back to facilitate scheduling scan.  

## 2020-10-20 LAB — HM MAMMOGRAPHY

## 2020-10-20 LAB — HM DEXA SCAN

## 2020-10-21 ENCOUNTER — Inpatient Hospital Stay: Payer: Medicare HMO | Attending: Nurse Practitioner | Admitting: Nurse Practitioner

## 2020-10-21 ENCOUNTER — Other Ambulatory Visit: Payer: Self-pay

## 2020-10-21 ENCOUNTER — Ambulatory Visit
Admission: RE | Admit: 2020-10-21 | Discharge: 2020-10-21 | Disposition: A | Discharge status: Home / Self Care | Payer: Medicare HMO | Source: Ambulatory Visit | Attending: Nurse Practitioner | Admitting: Nurse Practitioner

## 2020-10-21 DIAGNOSIS — Z122 Encounter for screening for malignant neoplasm of respiratory organs: Secondary | ICD-10-CM | POA: Insufficient documentation

## 2020-10-21 DIAGNOSIS — Z87891 Personal history of nicotine dependence: Secondary | ICD-10-CM

## 2020-10-21 NOTE — Progress Notes (Signed)
Virtual Visit via Video Enabled Telemedicine Note   I connected with Erika Smith on 10/21/20 at 1:45 PM EST by video enabled telemedicine visit and verified that I am speaking with the correct person using two identifiers.   I discussed the limitations, risks, security and privacy concerns of performing an evaluation and management service by telemedicine and the availability of in-person appointments. I also discussed with the patient that there may be a patient responsible charge related to this service. The patient expressed understanding and agreed to proceed.   Other persons participating in the visit and their role in the encounter: Burgess Estelle, RN- checking in patient & navigation  Patient's location: [place]  Provider's location: Clinic  Chief Complaint: Low Dose CT Screening  Patient agreed to evaluation by telemedicine to discuss shared decision making for consideration of low dose CT lung cancer screening.    In accordance with CMS guidelines, patient has met eligibility criteria including age, absence of signs or symptoms of lung cancer.  Social History   Tobacco Use  . Smoking status: Former Smoker    Packs/day: 1.00    Years: 33.00    Pack years: 33.00    Start date: 1976    Quit date: 2008    Years since quitting: 14.0  . Smokeless tobacco: Never Used  Substance Use Topics  . Alcohol use: No     A shared decision-making session was conducted prior to the performance of CT scan. This includes one or more decision aids, includes benefits and harms of screening, follow-up diagnostic testing, over-diagnosis, false positive rate, and total radiation exposure.   Counseling on the importance of adherence to annual lung cancer LDCT screening, impact of co-morbidities, and ability or willingness to undergo diagnosis and treatment is imperative for compliance of the program.   Counseling on the importance of continued smoking cessation for former smokers; the  importance of smoking cessation for current smokers, and information about tobacco cessation interventions have been given to patient including [place] and [address] [employer] [place] programs.   Written order for lung cancer screening with LDCT has been given to the patient and any and all questions have been answered to the best of my abilities.    Yearly follow up will be coordinated by Burgess Estelle, Thoracic Navigator.  I discussed the assessment and treatment plan with the patient. The patient was provided an opportunity to ask questions and all were answered. The patient agreed with the plan and demonstrated an understanding of the instructions.   The patient was advised to call back or seek an in-person evaluation if the symptoms worsen or if the condition fails to improve as anticipated.   I provided 15 minutes of face-to-face video visit time dedicated to the care of this patient on the date of this encounter to include pre-visit review of smoking history, face-to-face time with the patient, and post visit ordering of testing/documentation.   Beckey Rutter, DNP, AGNP-C [place] at [facility] [phone] (clinic)

## 2020-10-22 ENCOUNTER — Telehealth: Payer: Self-pay

## 2020-10-22 DIAGNOSIS — E785 Hyperlipidemia, unspecified: Secondary | ICD-10-CM

## 2020-10-22 DIAGNOSIS — I700 Atherosclerosis of aorta: Secondary | ICD-10-CM

## 2020-10-22 NOTE — Telephone Encounter (Signed)
I called and advised patient of normal mammogram results from 10/20/2020. Patient asked if the results from her CT chest are in. Please review results and advise.

## 2020-10-23 ENCOUNTER — Encounter: Payer: Self-pay | Admitting: *Deleted

## 2020-10-24 DIAGNOSIS — I700 Atherosclerosis of aorta: Secondary | ICD-10-CM | POA: Insufficient documentation

## 2020-10-24 MED ORDER — ATORVASTATIN CALCIUM 20 MG PO TABS: 20.0000 mg | ORAL_TABLET | Freq: Every day | ORAL | 3 refills | Status: DC

## 2020-10-24 NOTE — Telephone Encounter (Signed)
Pt called and verbalized understanding of information below. Pt agreed to take atorvastatin for cholesterol and is scheduled to f/u in 3 m/o.

## 2020-10-24 NOTE — Telephone Encounter (Signed)
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 This indicates increased risk for developing heart disease or strokes, so should start a cholesterol medication. Recommend atorvastatin 20mg  once a day, #30, rf x 3 and follow up in 3 months  To check cholesterol. Also indicates some emphysema and small unchanged benign appearing lung nodule. Need to check one a year.

## 2020-10-27 ENCOUNTER — Encounter: Payer: Self-pay | Admitting: Family Medicine

## 2020-10-27 ENCOUNTER — Telehealth: Payer: Self-pay | Admitting: Family Medicine

## 2020-10-27 DIAGNOSIS — M8580 Other specified disorders of bone density and structure, unspecified site: Secondary | ICD-10-CM

## 2020-10-27 NOTE — Telephone Encounter (Signed)
Bone density test shows osteopenia of spine and hip. Should take OTC  Vitamin D3 000 units daily. Recheck in 2-3 years to make sure does not progress to osteoporosis.

## 2020-10-27 NOTE — Telephone Encounter (Signed)
Patient advised. Please clarify the dosage of OTC vitamin D3 that patient should take.

## 2020-10-27 NOTE — Telephone Encounter (Signed)
2000 units once a day

## 2020-10-27 NOTE — Telephone Encounter (Signed)
Patient advised.

## 2020-11-05 ENCOUNTER — Encounter: Payer: Medicare HMO | Admitting: Physician Assistant

## 2020-12-02 ENCOUNTER — Encounter: Payer: Self-pay | Admitting: Family Medicine

## 2020-12-02 ENCOUNTER — Ambulatory Visit (INDEPENDENT_AMBULATORY_CARE_PROVIDER_SITE_OTHER): Payer: Medicare HMO | Admitting: Family Medicine

## 2020-12-02 ENCOUNTER — Other Ambulatory Visit: Payer: Self-pay

## 2020-12-02 VITALS — BP 111/73 | HR 84 | Temp 97.5°F | Resp 16 | Ht 60.0 in | Wt 142.0 lb

## 2020-12-02 DIAGNOSIS — Z23 Encounter for immunization: Secondary | ICD-10-CM

## 2020-12-02 DIAGNOSIS — I700 Atherosclerosis of aorta: Secondary | ICD-10-CM

## 2020-12-02 DIAGNOSIS — Z0000 Encounter for general adult medical examination without abnormal findings: Secondary | ICD-10-CM

## 2020-12-02 DIAGNOSIS — R32 Unspecified urinary incontinence: Secondary | ICD-10-CM | POA: Diagnosis not present

## 2020-12-02 DIAGNOSIS — Z1159 Encounter for screening for other viral diseases: Secondary | ICD-10-CM | POA: Diagnosis not present

## 2020-12-02 DIAGNOSIS — R0600 Dyspnea, unspecified: Secondary | ICD-10-CM

## 2020-12-02 DIAGNOSIS — E785 Hyperlipidemia, unspecified: Secondary | ICD-10-CM

## 2020-12-02 DIAGNOSIS — Z289 Immunization not carried out for unspecified reason: Secondary | ICD-10-CM

## 2020-12-02 DIAGNOSIS — J432 Centrilobular emphysema: Secondary | ICD-10-CM

## 2020-12-02 DIAGNOSIS — R0609 Other forms of dyspnea: Secondary | ICD-10-CM

## 2020-12-02 DIAGNOSIS — R7303 Prediabetes: Secondary | ICD-10-CM

## 2020-12-02 DIAGNOSIS — M8580 Other specified disorders of bone density and structure, unspecified site: Secondary | ICD-10-CM | POA: Diagnosis not present

## 2020-12-02 LAB — POCT URINALYSIS DIPSTICK
Bilirubin, UA: NEGATIVE
Blood, UA: NEGATIVE
Glucose, UA: NEGATIVE
Ketones, UA: NEGATIVE
Leukocytes, UA: NEGATIVE
Nitrite, UA: NEGATIVE
Protein, UA: NEGATIVE
Spec Grav, UA: 1.025 (ref 1.010–1.025)
Urobilinogen, UA: 0.2 E.U./dL
pH, UA: 6 (ref 5.0–8.0)

## 2020-12-02 MED ORDER — SHINGRIX 50 MCG/0.5ML IM SUSR: 0.5000 mL | Freq: Once | INTRAMUSCULAR | 0 refills | Status: AC

## 2020-12-02 MED ORDER — TETANUS-DIPHTH-ACELL PERTUSSIS 5-2.5-18.5 LF-MCG/0.5 IM SUSY: 0.5000 mL | PREFILLED_SYRINGE | Freq: Once | INTRAMUSCULAR | 0 refills | Status: AC

## 2020-12-02 MED ORDER — TIOTROPIUM BROMIDE-OLODATEROL 2.5-2.5 MCG/ACT IN AERS: 2.0000 | INHALATION_SPRAY | Freq: Every day | RESPIRATORY_TRACT | 0 refills | Status: DC

## 2020-12-02 MED ORDER — OXYBUTYNIN CHLORIDE ER 5 MG PO TB24: 5.0000 mg | ORAL_TABLET | Freq: Every day | ORAL | 1 refills | Status: DC

## 2020-12-02 NOTE — Progress Notes (Signed)
Complete physical exam   Patient: Erika Smith   DOB: 31-Dec-1955   65 y.o. Female  MRN: [id number] Visit Date: 12/02/2020  Today's healthcare provider: Mila Merry, MD   Chief Complaint  Patient presents with  . Annual Exam  . Hypertension  . Hyperglycemia   Subjective    Erika Smith is a 65 y.o. female who presents today for a complete physical exam.  She reports consuming a general diet. The patient does not participate in regular exercise at present. She generally feels fairly well. She reports sleeping poorly. Has trouble falling asleep and trouble staying asleep. She does have additional problems to discuss today.   Had AWV with [facility] on 10/01/2020  HPI  Lipid/Cholesterol, Follow-up  Last lipid panel Other pertinent labs  Lab Results  Component Value Date   CHOL 244 (H) 09/19/2019   HDL 86 09/19/2019   LDLCALC 148 (H) 09/19/2019   TRIG 61 09/19/2019   CHOLHDL 2.8 09/19/2019   Lab Results  Component Value Date   ALT 16 09/19/2019   AST 20 09/19/2019   PLT 250 09/19/2019   TSH 3.280 09/19/2019     She was last seen for this 1 months ago.  Management since that visit includes started atorvastatin 20mg  once a day due to CT scan showing cholesterol plaques in aortic artery.  She reports good compliance with treatment. She is not having side effects.   Symptoms: No chest pain No chest pressure/discomfort  No dyspnea No lower extremity edema  No numbness or tingling of extremity No orthopnea  No palpitations No paroxysmal nocturnal dyspnea  No speech difficulty No syncope   Current diet: well balanced Current exercise: none  The 10-year ASCVD risk score DC Jr., et al., 2013) is: 4.4%  ---------------------------------------------------------------------------------------------------  Hyperglycemia, Follow-up  Lab Results  Component Value Date   HGBA1C 5.8 (H) 09/03/2016   GLUCOSE 111 (H) 09/19/2019   GLUCOSE 96 09/03/2016     Last seen for for this 5 years ago.  Management since that visit includes continue healthy diet. Current symptoms include none and have been stable.  Prior visit with dietician: no Current diet: well balanced Current exercise: none  Pertinent Labs:    Component Value Date/Time   CHOL 244 (H) 09/19/2019 1126   TRIG 61 09/19/2019 1126   CHOLHDL 2.8 09/19/2019 1126   CREATININE 0.68 09/19/2019 1126    Wt Readings from Last 3 Encounters:  12/02/20 142 lb (64.4 kg)  10/21/20 137 lb (62.1 kg)  09/19/19 132 lb 3.2 oz (60 kg)    -----------------------------------------------------------------------------------------  Urinary incontinence: Patient complains of leaking of urine for several years. She reports this problem has worsened within the past 6 months. She states she will suddenly have an urge to urinate, and will urinate on her self if she doesn't get to the restroom fast enough. Patient states symptoms worsen with sneezing or coughing. She denies any pain or burning during uriantion.   She also has remote history of COPD previously followed by pulmonary and prescribed Advair. She quit smoking nearly 15 years ago and stopped inhalers several years after stopping smoking, however she states that over the last several months she has noticed more difficulty breathing when walking or exerting herself. LDCT did not mild centrilobular emphysema.  No past medical history on file. Past Surgical History:  Procedure Laterality Date  . ABDOMINAL HYSTERECTOMY  1982   BSO  . APPENDECTOMY  1982  . CARDIAC CATHETERIZATION  11/2007  . CHOLECYSTECTOMY  2010  . MR Brain, Brain Stem  05/09/2007    Normal MRI.  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 UPPER GI ENDOSCOPY  02/02/2010   Dr. Bluford Kaufmann. LA Grade A reflux esophagitis.   Social History   Socioeconomic History  . Marital status: Married    Spouse name: Not on  file  . Number of children: 2  . Years of education: Not on file  . Highest education level: Associate degree: occupational, Scientist, product/process development, or vocational program  Occupational History  . 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 Smoking status: Former Smoker    Packs/day: 1.00    Years: 33.00    Pack years: 33.00    Start date: 1976    Quit date: 2008    Years since quitting: 14.2  . Smokeless tobacco: Never Used  Vaping Use  . Vaping Use: Former  . Devices: E cigarette - quit in 2008  Substance and Sexual Activity  . Alcohol use: No  . Drug use: No  . Sexual activity: Not on file  Other Topics Concern  . Not on file  Social History Narrative  . Not on file   Social Determinants of Health   Financial Resource Strain: Low Risk   . Difficulty of Paying Living Expenses: Not hard at all  Food Insecurity: No Food Insecurity  . Worried About Programme researcher, broadcasting/film/video in the Last Year: Never true  . Ran Out of Food in the Last Year: Never true  Transportation Needs: No Transportation Needs  . Lack of Transportation (Medical): No  . Lack of Transportation (Non-Medical): No  Physical Activity: Inactive  . Days of Exercise per Week: 0 days  . Minutes of Exercise per Session: 0 min  Stress: No Stress Concern Present  . Feeling of Stress : Not at all  Social Connections: Socially Integrated  . Frequency of Communication with Friends and Family: More than three times a week  . Frequency of Social Gatherings with Friends and Family: More than three times a week  . Attends Religious Services: More than 4 times per year  . Active Member of Clubs or Organizations: Yes  . Attends Banker Meetings: Never  . Marital Status: Married  Catering manager Violence: Not At Risk  . Fear of Current or Ex-Partner: No  . Emotionally Abused: No  . Physically Abused: No  . Sexually Abused: No   Family Status  Relation Name Status  . Mother  Deceased       cause of  death: Stroke  . Father  Deceased       had CABG  . Sister  Alive  . Brother  Alive  . Other grandmother (Not Specified)  . Sister  Deceased  . Brother  Alive   Family History  Problem Relation Age of Onset  . Stroke Mother   . Diabetes Mother   . Diabetes Mellitus II Father   . Stroke Father   . Heart disease Father   . Congestive Heart Failure Father   . Allergies Sister   . Asthma Sister   . Diabetes Brother   . Heart disease Brother   . Leukemia Other   . Heart disease Sister        Had triple bypass  . Cancer - Other Sister        Cancer  Allergies  Allergen Reactions  . Codeine   . Morphine   . Percocet  [Oxycodone-Acetaminophen]   . Tramadol Hcl     Patient Care Team: Malva LimesFisher, Oden Lindaman E, MD as PCP - General (Family Medicine) [place], [facility](Optometry)   Medications: Outpatient Medications Prior to Visit  Medication Sig  . atorvastatin (LIPITOR) 20 MG tablet Take 1 tablet (20 mg total) by mouth daily.  . Multiple Vitamins-Minerals (MULTIVITAMIN ADULT PO) Take 1 tablet by mouth daily.   No facility-administered medications prior to visit.    Review of Systems  Constitutional: Positive for fatigue. Negative for chills and fever.  HENT: Positive for trouble swallowing. Negative for congestion, ear pain, rhinorrhea, sneezing and sore throat.   Eyes: Negative.  Negative for pain and redness.  Respiratory: Positive for cough and shortness of breath. Negative for wheezing.   Cardiovascular: Negative for chest pain and leg swelling.  Gastrointestinal: Negative for abdominal pain, blood in stool, constipation, diarrhea and nausea.  Endocrine: Negative for polydipsia and polyphagia.  Genitourinary: Positive for enuresis and frequency. Negative for dysuria, flank pain, hematuria, pelvic pain, vaginal bleeding and vaginal discharge.  Musculoskeletal: Positive for arthralgias (hip pain). Negative for back pain, gait problem and joint swelling.  Skin: Negative  for rash.  Neurological: Negative.  Negative for dizziness, tremors, seizures, weakness, light-headedness, numbness and headaches.  Hematological: Negative for adenopathy.  Psychiatric/Behavioral: Negative.  Negative for behavioral problems, confusion and dysphoric mood. The patient is not nervous/anxious and is not hyperactive.       Objective    BP 111/73 (BP Location: Right Arm, Patient Position: Sitting, Cuff Size: Normal)   Pulse 84   Temp (!) 97.5 F (36.4 C) (Temporal)   Resp 16   Ht 5' (1.524 m)   Wt 142 lb (64.4 kg)   BMI 27.73 kg/m    Physical Exam   General Appearance:     Overweight female. Alert, cooperative, in no acute distress, appears stated age   Head:    Normocephalic, without obvious abnormality, atraumatic  Eyes:    PERRL, conjunctiva/corneas clear, EOM's intact, fundi    benign, both eyes  Ears:    Normal TM's and external ear canals, both ears  Neck:   Supple, symmetrical, trachea midline, no adenopathy;    thyroid:  no enlargement/tenderness/nodules; no carotid   bruit or JVD  Back:     Symmetric, no curvature, ROM normal, no CVA tenderness  Lungs:     Clear to auscultation bilaterally, respirations unlabored  Chest Wall:    No tenderness or deformity   Heart:    Normal heart rate. Normal rhythm. No murmurs, rubs, or gallops.   Breast Exam:    normal appearance, no masses or tenderness  Abdomen:     Soft, non-tender, bowel sounds active all four quadrants,    no masses, no organomegaly  Pelvic:    not indicated; status post hysterectomy, negative ROS  Extremities:   All extremities are intact. No cyanosis or edema  Pulses:   2+ and symmetric all extremities  Skin:   Skin color, texture, turgor normal, no rashes or lesions  Lymph nodes:   Cervical, supraclavicular, and axillary nodes normal  Neurologic:   CNII-XII intact, normal strength, sensation and reflexes    throughout    Last depression screening scores PHQ 2/9 Scores 12/02/2020 10/01/2020  09/19/2019  PHQ - 2 Score 0 0 0  PHQ- 9 Score 3 - -   Last fall risk screening Fall Risk  12/02/2020  Falls in the past year? 0  Number falls in past yr: 1  Injury with Fall? 0  Follow up Falls evaluation completed   Last Audit-C alcohol use screening Alcohol Use Disorder Test (AUDIT) 12/02/2020  1. How often do you have a drink containing alcohol? 0  2. How many drinks containing alcohol do you have on a typical day when you are drinking? 0  3. How often do you have six or more drinks on one occasion? 0  AUDIT-C Score 0  Alcohol Brief Interventions/Follow-up AUDIT Score <7 follow-up not indicated   A score of 3 or more in women, and 4 or more in men indicates increased risk for alcohol abuse, EXCEPT if all of the points are from question 1   No results found for any visits on 12/02/20.  Assessment & Plan    Routine Health Maintenance and Physical Exam  Exercise Activities and Dietary recommendations Goals    . DIET - REDUCE CALORIE INTAKE     Recommend to cut back on all carbohydrates in daily diet.        Immunization History  Administered Date(s) Administered  . Influenza Split 06/07/2009  . Pneumococcal Polysaccharide-23 07/19/2007  . Td 04/29/2005    Health Maintenance  Topic Date Due  . Hepatitis C Screening  Never done  . COVID-19 Vaccine (1) Never done  . COLONOSCOPY (Pts 45-20yrs Insurance coverage will need to be confirmed)  03/01/2019  . PNA vac Low Risk Adult (1 of 2 - PCV13) 08/19/2019  . INFLUENZA VACCINE  12/18/2020 (Originally 04/20/2020)  . TETANUS/TDAP  10/01/2021 (Originally 04/30/2015)  . MAMMOGRAM  10/20/2022  . DEXA SCAN  10/21/2023  . HPV VACCINES  Aged Out    Discussed health benefits of physical activity, and encouraged her to engage in regular exercise appropriate for her age and condition.  Has upcoming appt [facility] for colon cancer screening.  Anticipate Prevnar-20 as soon as it is available. Had one does of Pneumovax before age 16  1.  Need for hepatitis C screening test  - Hepatitis C antibody  2. Prescription for Shingrix. Vaccine not administered in office.   - Zoster Vaccine Adjuvanted [facility]) injection; Inject 0.5 mLs into the muscle once for 1 dose.  Dispense: 0.5 mL; Refill: 0  3. Prescription for Tdap. Vaccine not administered in office.   - Tdap (BOOSTRIX) 5-2.5-18.5 LF-MCG/0.5 injection; Inject 0.5 mLs into the muscle once for 1 dose.  Dispense: 0.5 mL; Refill: 0  4. Aortic atherosclerosis (HCC) Note on LDCT. She is tolerating atorvastatin well with no adverse effects.    5. Osteopenia, unspecified location  - VITAMIN D 25 Hydroxy (Vit-D Deficiency, Fractures)  6. Pre-diabetes  - Hemoglobin A1c  7. Hyperlipidemia, unspecified hyperlipidemia type She is tolerating atorvastatin well with no adverse effects.   - CBC - Comprehensive metabolic panel - Lipid panel  8. Centrilobular emphysema (HCC) Previously on inhalers (suspect Advair) many years ago. Sx had improved but starting to have some trouble with DOE the last few months.  - Tiotropium Bromide-Olodaterol 2.5-2.5 MCG/ACT AERS; Inhale 2 puffs into the lungs daily.  Dispense: 8 g; Refill: 0  9. Urinary incontinence, unspecified type Sounds like combination stress and urge. She has already tried [employer] exercises. Offered urology referral. She would like to try medication first.  - oxybutynin (DITROPAN XL) 5 MG 24 hr tablet; Take 1 tablet (5 mg total) by mouth at bedtime.  Dispense: 30 tablet; Refill: 1  10. Dyspnea on exertion  Secondary to COPD with remote smoking history as above.  - Tiotropium Bromide-Olodaterol 2.5-2.5 MCG/ACT AERS; Inhale 2 puffs into the lungs daily.  Dispense: 8 g; Refill: 0        The entirety of the information documented in the History of Present Illness, Review of Systems and Physical Exam were personally obtained by me. Portions of this information were initially documented by the CMA and reviewed by me for  thoroughness and accuracy.      Mila Merry, MD  [facility] [phone] (phone) [phone] (fax)  [facility]

## 2020-12-02 NOTE — Patient Instructions (Addendum)
.   You are due for a Tdap (tetanus-diptheria-pertussis vaccine) which protects you from tetanus and whooping cough. Please check with your insurance plan or pharmacy regarding coverage for this vaccine.     The CDC recommends two doses of Shingrix (the shingles vaccine) separated by 2 to 6 months for adults age 67 years and older. I recommend checking with your insurance plan regarding coverage for this vaccine.    Call for a referral to a urologist if oxybutynin is not helping with bladder control after a few weeks.

## 2020-12-04 LAB — COMPREHENSIVE METABOLIC PANEL
ALT: 16 IU/L (ref 0–32)
AST: 19 IU/L (ref 0–40)
Albumin/Globulin Ratio: 2.3 — ABNORMAL HIGH (ref 1.2–2.2)
Albumin: 4.6 g/dL (ref 3.8–4.8)
Alkaline Phosphatase: 94 IU/L (ref 44–121)
BUN/Creatinine Ratio: 25 (ref 12–28)
BUN: 19 mg/dL (ref 8–27)
Bilirubin Total: 0.3 mg/dL (ref 0.0–1.2)
CO2: 21 mmol/L (ref 20–29)
Calcium: 9.5 mg/dL (ref 8.7–10.3)
Chloride: 104 mmol/L (ref 96–106)
Creatinine, Ser: 0.75 mg/dL (ref 0.57–1.00)
Globulin, Total: 2 g/dL (ref 1.5–4.5)
Glucose: 112 mg/dL — ABNORMAL HIGH (ref 65–99)
Potassium: 4.7 mmol/L (ref 3.5–5.2)
Sodium: 140 mmol/L (ref 134–144)
Total Protein: 6.6 g/dL (ref 6.0–8.5)
eGFR: 88 mL/min/{1.73_m2} (ref >59)

## 2020-12-04 LAB — HEPATITIS C ANTIBODY: Hep C Virus Ab: <0.1 s/co ratio (ref 0.0–0.9)

## 2020-12-04 LAB — CBC
Hematocrit: 38.5 % (ref 34.0–46.6)
Hemoglobin: 13 g/dL (ref 11.1–15.9)
MCH: 31.8 pg (ref 26.6–33.0)
MCHC: 33.8 g/dL (ref 31.5–35.7)
MCV: 94 fL (ref 79–97)
Platelets: 244 10*3/uL (ref 150–450)
RBC: 4.09 x10E6/uL (ref 3.77–5.28)
RDW: 11.6 % — ABNORMAL LOW (ref 11.7–15.4)
WBC: 5.4 10*3/uL (ref 3.4–10.8)

## 2020-12-04 LAB — LIPID PANEL
Chol/HDL Ratio: 2.7 ratio (ref 0.0–4.4)
Cholesterol, Total: 198 mg/dL (ref 100–199)
HDL: 73 mg/dL (ref >39)
LDL Chol Calc (NIH): 110 mg/dL — ABNORMAL HIGH (ref 0–99)
Triglycerides: 81 mg/dL (ref 0–149)
VLDL Cholesterol Cal: 15 mg/dL (ref 5–40)

## 2020-12-04 LAB — VITAMIN D 25 HYDROXY (VIT D DEFICIENCY, FRACTURES): Vit D, 25-Hydroxy: 42.3 ng/mL (ref 30.0–100.0)

## 2020-12-04 LAB — HEMOGLOBIN A1C
Est. average glucose Bld gHb Est-mCnc: 134 mg/dL
Hgb A1c MFr Bld: 6.3 % — ABNORMAL HIGH (ref 4.8–5.6)

## 2020-12-24 ENCOUNTER — Telehealth: Payer: Self-pay | Admitting: Family Medicine

## 2020-12-24 ENCOUNTER — Other Ambulatory Visit: Payer: Self-pay | Admitting: Family Medicine

## 2020-12-24 ENCOUNTER — Encounter: Payer: Self-pay | Admitting: Family Medicine

## 2020-12-24 DIAGNOSIS — R0609 Other forms of dyspnea: Secondary | ICD-10-CM

## 2020-12-24 DIAGNOSIS — J432 Centrilobular emphysema: Secondary | ICD-10-CM

## 2020-12-24 DIAGNOSIS — R32 Unspecified urinary incontinence: Secondary | ICD-10-CM

## 2020-12-24 DIAGNOSIS — R0600 Dyspnea, unspecified: Secondary | ICD-10-CM

## 2020-12-24 MED ORDER — TIOTROPIUM BROMIDE-OLODATEROL 2.5-2.5 MCG/ACT IN AERS: 2.0000 | INHALATION_SPRAY | Freq: Every day | RESPIRATORY_TRACT | 12 refills | Status: DC

## 2020-12-24 NOTE — Telephone Encounter (Signed)
Doing well with samples Stiolto per [employer]. Prescription sent to pharmacy.

## 2021-01-28 ENCOUNTER — Ambulatory Visit: Payer: Self-pay | Admitting: Family Medicine

## 2021-01-28 ENCOUNTER — Other Ambulatory Visit: Payer: Self-pay | Admitting: Family Medicine

## 2021-01-28 DIAGNOSIS — R32 Unspecified urinary incontinence: Secondary | ICD-10-CM

## 2021-02-25 ENCOUNTER — Other Ambulatory Visit: Payer: Self-pay | Admitting: Family Medicine

## 2021-02-25 DIAGNOSIS — R32 Unspecified urinary incontinence: Secondary | ICD-10-CM

## 2021-03-22 ENCOUNTER — Encounter: Payer: Self-pay | Admitting: Family Medicine

## 2021-03-22 DIAGNOSIS — Z86010 Personal history of colonic polyps: Secondary | ICD-10-CM | POA: Insufficient documentation

## 2021-04-02 ENCOUNTER — Other Ambulatory Visit: Payer: Self-pay | Admitting: Family Medicine

## 2021-04-02 DIAGNOSIS — R32 Unspecified urinary incontinence: Secondary | ICD-10-CM

## 2021-04-29 ENCOUNTER — Other Ambulatory Visit: Payer: Self-pay | Admitting: Family Medicine

## 2021-04-29 DIAGNOSIS — R32 Unspecified urinary incontinence: Secondary | ICD-10-CM

## 2021-07-28 ENCOUNTER — Other Ambulatory Visit: Payer: Self-pay | Admitting: Family Medicine

## 2021-07-28 DIAGNOSIS — R32 Unspecified urinary incontinence: Secondary | ICD-10-CM

## 2021-07-28 NOTE — Telephone Encounter (Signed)
Requested Prescriptions  Pending Prescriptions Disp Refills  . oxybutynin (DITROPAN-XL) 5 MG 24 hr tablet [Pharmacy Med Name: OXYBUTYNIN CL ER 5 MG TABLET] 90 tablet 0    Sig: TAKE 1 TABLET BY MOUTH EVERYDAY AT BEDTIME     Urology:  Bladder Agents Passed - 07/28/2021  1:26 AM      Passed - Valid encounter within last 12 months    Recent Outpatient Visits          7 months ago Aortic atherosclerosis [facility])   [facility] Malva Limes, MD   10 months ago Cough   [facility] [place]   1 year ago Lymphadenopathy   [facility] Maple Hudson., MD   2 years ago Chest pain, unspecified type   [facility] Malva Limes, MD   3 years ago Left hip pain   [facility] Malva Limes, MD

## 2021-10-08 ENCOUNTER — Other Ambulatory Visit: Payer: Self-pay

## 2021-10-08 ENCOUNTER — Ambulatory Visit (INDEPENDENT_AMBULATORY_CARE_PROVIDER_SITE_OTHER): Payer: Medicare HMO

## 2021-10-08 VITALS — BP 104/62 | HR 67 | Temp 98.0°F | Ht 60.0 in | Wt 138.3 lb

## 2021-10-08 DIAGNOSIS — Z1231 Encounter for screening mammogram for malignant neoplasm of breast: Secondary | ICD-10-CM | POA: Diagnosis not present

## 2021-10-08 DIAGNOSIS — Z0000 Encounter for general adult medical examination without abnormal findings: Secondary | ICD-10-CM | POA: Diagnosis not present

## 2021-10-08 NOTE — Progress Notes (Signed)
Subjective:   Erika Smith is a 66 y.o. female who presents for Medicare Annual (Subsequent) preventive examination.  Review of Systems           Objective:    Today's Vitals   10/08/21 1020  BP: 104/62  Pulse: 67  Temp: 98 F (36.7 C)  TempSrc: Oral  SpO2: 100%  Weight: 138 lb 4.8 oz (62.7 kg)  Height: 5' (1.524 m)   Body mass index is 27.01 kg/m.  Advanced Directives 10/01/2020  Does Patient Have a Medical Advance Directive? No  Does patient want to make changes to medical advance directive? No - Patient declined    Current Medications (verified) Outpatient Encounter Medications as of 10/08/2021  Medication Sig   atorvastatin (LIPITOR) 20 MG tablet Take 1 tablet (20 mg total) by mouth daily.   Multiple Vitamins-Minerals (MULTIVITAMIN ADULT PO) Take 1 tablet by mouth daily.   oxybutynin (DITROPAN-XL) 5 MG 24 hr tablet TAKE 1 TABLET BY MOUTH EVERYDAY AT BEDTIME   Tiotropium Bromide-Olodaterol 2.5-2.5 MCG/ACT AERS Inhale 2 puffs into the lungs daily.   No facility-administered encounter medications on file as of 10/08/2021.    Allergies (verified) Codeine, Morphine, Percocet  [oxycodone-acetaminophen], and Tramadol hcl   History: No past medical history on file. Past Surgical History:  Procedure Laterality Date   ABDOMINAL HYSTERECTOMY  1982   BSO   APPENDECTOMY  1982   CARDIAC CATHETERIZATION     11/2007   CHOLECYSTECTOMY  2010   MR Brain, Brain Stem  05/09/2007    Normal MRI.  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 Bluford Kaufmann. [place] Grade A reflux esophagitis.   Family History  Problem Relation Age of Onset   Stroke Mother    Diabetes Mother    Diabetes Mellitus II Father    Stroke Father    Heart disease Father    Congestive Heart Failure Father    Allergies Sister    Asthma Sister    Diabetes Brother    Heart disease Brother    Leukemia Other     Heart disease Sister        Had triple bypass   Cancer - Other Sister        Cancer   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  Tobacco Use   Smoking status: Former    Packs/day: 1.00    Years: 33.00    Pack years: 33.00    Types: Cigarettes    Start date: 1976    Quit date: 2008    Years since quitting: 15.0   Smokeless tobacco: Never  Vaping Use   Vaping Use: Former   Devices: E cigarette - quit in 2008  Substance and Sexual Activity   Alcohol use: No   Drug use: No   Sexual activity: Not on file  Other Topics Concern   Not on file  Social History Narrative   Not on file   Social Determinants of Health   Financial Resource Strain: Not on file  Food Insecurity: Not on file  Transportation Needs: Not on file  Physical Activity: Not on file  Stress: Not on file  Social Connections: Not on file    Tobacco Counseling Counseling given: Not Answered   Clinical Intake:  Pre-visit preparation completed: Yes  Pain : No/denies pain     Nutritional Risks: None Diabetes: No  How often do you need to have someone help you when you read instructions, pamphlets, or other written materials from your doctor or pharmacy?: 1 - Never  Diabetic?no  Interpreter Needed?: No  Information entered by :: Kennedy BuckerLorrie Sarim Rothman, LPN   Activities of Daily Living In your present state of health, do you have any difficulty performing the following activities: 12/02/2020  Hearing? N  Vision? N  Difficulty concentrating or making decisions? N  Walking or climbing stairs? Y  Dressing or bathing? N  Doing errands, shopping? N  Some recent data might be hidden    Patient Care Team: Malva LimesFisher, Donald E, MD as  PCP - General (Family Medicine) [facility], [facility] (Optometry)  Indicate any recent Medical Services you may have received from other than [facility] providers in the past year (date may be approximate).     Assessment:   This is a routine wellness examination for Erika Hashimotoatricia.  Hearing/Vision screen No results found.  Dietary issues and exercise activities discussed:     Goals Addressed   None    Depression Screen PHQ 2/9 Scores 12/02/2020 10/01/2020 09/19/2019 02/28/2018  PHQ - 2 Score 0 0 0 0  PHQ- 9 Score 3 - - 3    Fall Risk Fall Risk  12/02/2020 10/01/2020 09/19/2019  Falls in the past year? 0 1 1  Number falls in past yr: 1 1 0  Injury with Fall? 0 0 0  Follow up Falls evaluation completed Falls prevention discussed -    FALL RISK PREVENTION PERTAINING TO THE HOME:  Any stairs in or around the home? No  If so, are there any without handrails? No  Home free of loose throw rugs in walkways, pet beds, electrical cords, etc? Yes  Adequate lighting in your home to reduce risk of falls? Yes   ASSISTIVE DEVICES UTILIZED TO PREVENT FALLS:  Life alert? No  Use of a cane, walker or w/c? No  Grab bars in the bathroom? No  Shower chair or bench in shower? Yes  Elevated toilet seat or a handicapped toilet? No   TIMED UP AND GO:  Was the test performed? Yes .  Length of time to ambulate 10 feet: 4 sec.   Gait steady and fast without use of assistive device  Cognitive Function:     6CIT Screen 10/01/2020  What Year? 0 points  What month? 0 points  What time? 0 points  Count back from 20 0 points  Months in reverse 0 points  Repeat phrase 0 points  Total Score 0    Immunizations Immunization History  Administered Date(s) Administered   Influenza Split 06/07/2009   Pneumococcal Polysaccharide-23 07/19/2007   Td 04/29/2005    TDAP status: Due, Education has been provided regarding the importance of this vaccine. Advised may receive this vaccine at local pharmacy or  Health Dept. Aware to provide a copy of the vaccination record if obtained from local pharmacy or Health Dept. Verbalized acceptance and understanding.  Flu Vaccine status: Declined, Education has been provided regarding the importance of this vaccine but patient still declined. Advised may receive this vaccine at local pharmacy or Health Dept. Aware to provide a copy of the vaccination record if obtained  from local pharmacy or Health Dept. Verbalized acceptance and understanding.  Pneumococcal vaccine status: Up to date  Covid-19 vaccine status: Declined, Education has been provided regarding the importance of this vaccine but patient still declined. Advised may receive this vaccine at local pharmacy or Health Dept.or vaccine clinic. Aware to provide a copy of the vaccination record if obtained from local pharmacy or Health Dept. Verbalized acceptance and understanding.  Qualifies for Shingles Vaccine? Yes   Zostavax completed No   Shingrix Completed?: No.    Education has been provided regarding the importance of this vaccine. Patient has been advised to call insurance company to determine out of pocket expense if they have not yet received this vaccine. Advised may also receive vaccine at local pharmacy or Health Dept. Verbalized acceptance and understanding.  Screening Tests Health Maintenance  Topic Date Due   COVID-19 Vaccine (1) Never done   Zoster Vaccines- Shingrix (1 of 2) Never done   Pneumonia Vaccine 30+ Years old (2 - PCV) 07/18/2008   TETANUS/TDAP  04/30/2015   COLONOSCOPY (Pts 45-53yrs Insurance coverage will need to be confirmed)  03/01/2019   INFLUENZA VACCINE  04/20/2021   MAMMOGRAM  10/20/2022   DEXA SCAN  10/21/2023   Hepatitis C Screening  Completed   HPV VACCINES  Aged Out    Health Maintenance  Health Maintenance Due  Topic Date Due   COVID-19 Vaccine (1) Never done   Zoster Vaccines- Shingrix (1 of 2) Never done   Pneumonia Vaccine 76+ Years old (2 - PCV)  07/18/2008   TETANUS/TDAP  04/30/2015   COLONOSCOPY (Pts 45-34yrs Insurance coverage will need to be confirmed)  03/01/2019   INFLUENZA VACCINE  04/20/2021    Colorectal cancer screening: Type of screening: Colonoscopy. Completed last year. Repeat every 10 years  Mammogram status: Completed 10/20/20. Repeat every year- referral sent  Bone Density status: Completed 10/20/20. Results reflect: Bone density results: NORMAL. Repeat every 5 years.  Lung Cancer Screening: (Low Dose CT Chest recommended if Age 53-80 years, 30 pack-year currently smoking OR have quit w/in 15years.) does qualify.   Lung Cancer Screening Referral: declined referral  Additional Screening:  Hepatitis C Screening: does qualify; Completed 12/03/20  Vision Screening: Recommended annual ophthalmology exams for early detection of glaucoma and other disorders of the eye. Is the patient up to date with their annual eye exam?  Yes  Who is the provider or what is the name of the office in which the patient attends annual eye exams? [facility] If pt is not established with a provider, would they like to be referred to a provider to establish care? No .   Dental Screening: Recommended annual dental exams for proper oral hygiene  Community Resource Referral / Chronic Care Management: CRR required this visit?  No   CCM required this visit?  No      Plan:     I have personally reviewed and noted the following in the patients chart:   Medical and social history Use of alcohol, tobacco or illicit drugs  Current medications and supplements including opioid prescriptions.  Functional ability and status Nutritional status Physical activity Advanced directives List of other physicians Hospitalizations, surgeries, and ER visits in previous 12 months Vitals Screenings to include cognitive, depression, and falls Referrals and appointments  In addition, I have reviewed and discussed with patient certain  preventive protocols, quality metrics, and best practice recommendations. A written personalized care plan for preventive services as well as general preventive health recommendations  were provided to patient.     Hal HopeLorrie S Amarea Macdowell, LPN   [id number]/09601/19/2023   Nurse Notes: none

## 2021-10-08 NOTE — Patient Instructions (Signed)
[name] , Thank you for taking time to come for your Medicare Wellness Visit. I appreciate your ongoing commitment to your health goals. Please review the following plan we discussed and let me know if I can assist you in the future.   Screening recommendations/referrals: Colonoscopy: last year Mammogram: 10/20/20, referral sent Bone Density: 10/20/20 Recommended yearly ophthalmology/optometry visit for glaucoma screening and checkup Recommended yearly dental visit for hygiene and checkup  Vaccinations: Influenza vaccine: n/d Pneumococcal vaccine: 07/19/07 Tdap vaccine: 04/29/05, due Shingles vaccine: n/d   Covid-19:n/d  Advanced directives: no  Conditions/risks identified: none  Next appointment: Follow up in one year for your annual wellness visit 10/13/22 @ 9am in person   Preventive Care 68 Years and Older, Female Preventive care refers to lifestyle choices and visits with your health care provider that can promote health and wellness. What does preventive care include? A yearly physical exam. This is also called an annual well check. Dental exams once or twice a year. Routine eye exams. Ask your health care provider how often you should have your eyes checked. Personal lifestyle choices, including: Daily care of your teeth and gums. Regular physical activity. Eating a healthy diet. Avoiding tobacco and drug use. Limiting alcohol use. Practicing safe sex. Taking low-dose aspirin every day. Taking vitamin and mineral supplements as recommended by your health care provider. What happens during an annual well check? The services and screenings done by your health care provider during your annual well check will depend on your age, overall health, lifestyle risk factors, and family history of disease. Counseling  Your health care provider may ask you questions about your: Alcohol use. Tobacco use. Drug use. Emotional well-being. Home and relationship well-being. Sexual  activity. Eating habits. History of falls. Memory and ability to understand (cognition). Work and work Statistician. Reproductive health. Screening  You may have the following tests or measurements: Height, weight, and BMI. Blood pressure. Lipid and cholesterol levels. These may be checked every 5 years, or more frequently if you are over 79 years old. Skin check. Lung cancer screening. You may have this screening every year starting at age 44 if you have a 30-pack-year history of smoking and currently smoke or have quit within the past 15 years. Fecal occult blood test (FOBT) of the stool. You may have this test every year starting at age 28. Flexible sigmoidoscopy or colonoscopy. You may have a sigmoidoscopy every 5 years or a colonoscopy every 10 years starting at age 56. Hepatitis C blood test. Hepatitis B blood test. Sexually transmitted disease (STD) testing. Diabetes screening. This is done by checking your blood sugar (glucose) after you have not eaten for a while (fasting). You may have this done every 1-3 years. Bone density scan. This is done to screen for osteoporosis. You may have this done starting at age 77. Mammogram. This may be done every 1-2 years. Talk to your health care provider about how often you should have regular mammograms. Talk with your health care provider about your test results, treatment options, and if necessary, the need for more tests. Vaccines  Your health care provider may recommend certain vaccines, such as: Influenza vaccine. This is recommended every year. Tetanus, diphtheria, and acellular pertussis (Tdap, Td) vaccine. You may need a Td booster every 10 years. Zoster vaccine. You may need this after age 5. Pneumococcal 13-valent conjugate (PCV13) vaccine. One dose is recommended after age 88. Pneumococcal polysaccharide (PPSV23) vaccine. One dose is recommended after age 56. Talk to your health  care provider about which screenings and vaccines  you need and how often you need them. This information is not intended to replace advice given to you by your health care provider. Make sure you discuss any questions you have with your health care provider. Document Released: 10/03/2015 Document Revised: 05/26/2016 Document Reviewed: 07/08/2015 Elsevier Interactive Patient Education  2017 [place] Prevention in the Home Falls can cause injuries. They can happen to people of all ages. There are many things you can do to make your home safe and to help prevent falls. What can I do on the outside of my home? Regularly fix the edges of walkways and driveways and fix any cracks. Remove anything that might make you trip as you walk through a door, such as a raised step or threshold. Trim any bushes or trees on the path to your home. Use bright outdoor lighting. Clear any walking paths of anything that might make someone trip, such as rocks or tools. Regularly check to see if handrails are loose or broken. Make sure that both sides of any steps have handrails. Any raised decks and porches should have guardrails on the edges. Have any leaves, snow, or ice cleared regularly. Use sand or salt on walking paths during winter. Clean up any spills in your garage right away. This includes oil or grease spills. What can I do in the bathroom? Use night lights. Install grab bars by the toilet and in the tub and shower. Do not use towel bars as grab bars. Use non-skid mats or decals in the tub or shower. If you need to sit down in the shower, use a plastic, non-slip stool. Keep the floor dry. Clean up any water that spills on the floor as soon as it happens. Remove soap buildup in the tub or shower regularly. Attach bath mats securely with double-sided non-slip rug tape. Do not have throw rugs and other things on the floor that can make you trip. What can I do in the bedroom? Use night lights. Make sure that you have a light by your bed that  is easy to reach. Do not use any sheets or blankets that are too big for your bed. They should not hang down onto the floor. Have a firm chair that has side arms. You can use this for support while you get dressed. Do not have throw rugs and other things on the floor that can make you trip. What can I do in the kitchen? Clean up any spills right away. Avoid walking on wet floors. Keep items that you use a lot in easy-to-reach places. If you need to reach something above you, use a strong step stool that has a grab bar. Keep electrical cords out of the way. Do not use floor polish or wax that makes floors slippery. If you must use wax, use non-skid floor wax. Do not have throw rugs and other things on the floor that can make you trip. What can I do with my stairs? Do not leave any items on the stairs. Make sure that there are handrails on both sides of the stairs and use them. Fix handrails that are broken or loose. Make sure that handrails are as long as the stairways. Check any carpeting to make sure that it is firmly attached to the stairs. Fix any carpet that is loose or worn. Avoid having throw rugs at the top or bottom of the stairs. If you do have throw rugs, attach them to  the floor with carpet tape. Make sure that you have a light switch at the top of the stairs and the bottom of the stairs. If you do not have them, ask someone to add them for you. What else can I do to help prevent falls? Wear shoes that: Do not have high heels. Have rubber bottoms. Are comfortable and fit you well. Are closed at the toe. Do not wear sandals. If you use a stepladder: Make sure that it is fully opened. Do not climb a closed stepladder. Make sure that both sides of the stepladder are locked into place. Ask someone to hold it for you, if possible. Clearly mark and make sure that you can see: Any grab bars or handrails. First and last steps. Where the edge of each step is. Use tools that help you  move around (mobility aids) if they are needed. These include: Canes. Walkers. Scooters. Crutches. Turn on the lights when you go into a dark area. Replace any light bulbs as soon as they burn out. Set up your furniture so you have a clear path. Avoid moving your furniture around. If any of your floors are uneven, fix them. If there are any pets around you, be aware of where they are. Review your medicines with your doctor. Some medicines can make you feel dizzy. This can increase your chance of falling. Ask your doctor what other things that you can do to help prevent falls. This information is not intended to replace advice given to you by your health care provider. Make sure you discuss any questions you have with your health care provider. Document Released: 07/03/2009 Document Revised: 02/12/2016 Document Reviewed: 10/11/2014 Elsevier Interactive Patient Education  2017 [employer].

## 2021-10-16 ENCOUNTER — Encounter: Payer: Self-pay | Admitting: Family Medicine

## 2021-10-16 ENCOUNTER — Other Ambulatory Visit: Payer: Self-pay

## 2021-10-16 ENCOUNTER — Ambulatory Visit (INDEPENDENT_AMBULATORY_CARE_PROVIDER_SITE_OTHER): Payer: Medicare HMO | Admitting: Family Medicine

## 2021-10-16 VITALS — BP 144/73 | HR 85 | Temp 98.5°F | Resp 14 | Wt 139.0 lb

## 2021-10-16 DIAGNOSIS — M79606 Pain in leg, unspecified: Secondary | ICD-10-CM | POA: Insufficient documentation

## 2021-10-16 DIAGNOSIS — N3946 Mixed incontinence: Secondary | ICD-10-CM | POA: Diagnosis not present

## 2021-10-16 DIAGNOSIS — G9331 Postviral fatigue syndrome: Secondary | ICD-10-CM | POA: Diagnosis not present

## 2021-10-16 DIAGNOSIS — J432 Centrilobular emphysema: Secondary | ICD-10-CM

## 2021-10-16 DIAGNOSIS — I700 Atherosclerosis of aorta: Secondary | ICD-10-CM

## 2021-10-16 DIAGNOSIS — R1032 Left lower quadrant pain: Secondary | ICD-10-CM | POA: Diagnosis not present

## 2021-10-16 DIAGNOSIS — M25552 Pain in left hip: Secondary | ICD-10-CM | POA: Diagnosis not present

## 2021-10-16 DIAGNOSIS — N3090 Cystitis, unspecified without hematuria: Secondary | ICD-10-CM

## 2021-10-16 LAB — POCT URINALYSIS DIPSTICK
Bilirubin, UA: NEGATIVE
Glucose, UA: NEGATIVE
Ketones, UA: NEGATIVE
Nitrite, UA: POSITIVE
Protein, UA: NEGATIVE
Spec Grav, UA: 1.01 (ref 1.010–1.025)
Urobilinogen, UA: 0.2 E.U./dL
pH, UA: 6 (ref 5.0–8.0)

## 2021-10-16 MED ORDER — SULFAMETHOXAZOLE-TRIMETHOPRIM 800-160 MG PO TABS: 1.0000 | ORAL_TABLET | Freq: Two times a day (BID) | ORAL | 0 refills | Status: DC

## 2021-10-16 MED ORDER — MELOXICAM 15 MG PO TABS: 15.0000 mg | ORAL_TABLET | Freq: Every day | ORAL | 0 refills | Status: DC

## 2021-10-16 MED ORDER — CYCLOBENZAPRINE HCL 5 MG PO TABS: 5.0000 mg | ORAL_TABLET | Freq: Three times a day (TID) | ORAL | 1 refills | Status: DC | PRN

## 2021-10-16 NOTE — Assessment & Plan Note (Signed)
Seen as mild on low dose CT scan Continue daily inhaler use Recent cough Negative cough at urgent care Continue OTC therapies as likely viral; reassurance provided

## 2021-10-16 NOTE — Assessment & Plan Note (Signed)
Unclear cause -negative work up for GI bleed; hx of normal colonoscopy; reports that stool color change has resolved -negative COVID -not interested in FLU vaccination; 'doesn't get the flu' education provided -repeat additional labs; pt educated that some vitamin labs may not be covered well by insurance -fatigue may be viral/linked to cough --> patient insistent to seek answer

## 2021-10-16 NOTE — Progress Notes (Signed)
Established patient visit   Patient: Erika Smith   DOB: 1956/02/07   66 y.o. Female  MRN: [id number] Visit Date: 10/16/2021  Today's healthcare provider: Jacky KindleElise T Gloyd Happ, FNP   Chief Complaint  Patient presents with   Stool Color Change   Fatigue   Subjective    HPI  Fatigue: Patient complains of fatigue for several months. She feels exhausted and states "I have no energy". Patient had  an episode of black stools for 5 days. She was seen by an urgent care and labs were ordered. Patient reports that her stool was negative for occult blood but some of her lab work was abnormal. Patient was advised to follow up with her PCP.  Medications: Outpatient Medications Prior to Visit  Medication Sig   atorvastatin (LIPITOR) 20 MG tablet Take 1 tablet (20 mg total) by mouth daily.   Multiple Vitamins-Minerals (MULTIVITAMIN ADULT PO) Take 1 tablet by mouth daily.   oxybutynin (DITROPAN-XL) 5 MG 24 hr tablet TAKE 1 TABLET BY MOUTH EVERYDAY AT BEDTIME   pantoprazole (PROTONIX) 20 MG tablet Take by mouth.   Tiotropium Bromide-Olodaterol 2.5-2.5 MCG/ACT AERS Inhale 2 puffs into the lungs daily.   No facility-administered medications prior to visit.    Review of Systems  Constitutional:  Positive for fatigue. Negative for appetite change, chills and fever.  Respiratory:  Positive for cough and shortness of breath. Negative for chest tightness.   Cardiovascular:  Negative for chest pain and palpitations.  Gastrointestinal:  Negative for abdominal pain, nausea and vomiting.  Musculoskeletal:  Positive for arthralgias (left hip pain).  Neurological:  Negative for dizziness and weakness.      Objective    BP (!) 144/73 (BP Location: Left Arm, Patient Position: Sitting, Cuff Size: Normal)    Pulse 85    Temp 98.5 F (36.9 C) (Oral)    Resp 14    Wt 139 lb (63 kg)    SpO2 96% Comment: room air   BMI 27.15 kg/m    Physical Exam Vitals and nursing note reviewed.  Constitutional:       General: She is not in acute distress.    Appearance: Normal appearance. She is overweight. She is not ill-appearing, toxic-appearing or diaphoretic.  HENT:     Head: Normocephalic and atraumatic.  Cardiovascular:     Rate and Rhythm: Normal rate and regular rhythm.     Pulses: Normal pulses.     Heart sounds: Normal heart sounds. No murmur heard.   No friction rub. No gallop.  Pulmonary:     Effort: Pulmonary effort is normal. No respiratory distress.     Breath sounds: Normal breath sounds. No stridor. No wheezing, rhonchi or rales.  Chest:     Chest wall: No tenderness.  Abdominal:     General: Bowel sounds are normal. There is no distension.     Palpations: Abdomen is soft. There is no mass.     Tenderness: There is no abdominal tenderness. There is no guarding or rebound.     Hernia: No hernia is present.       Comments: Pain at site of skin meet between labia and thigh No evidence of rash or yeast No trauma No hernia  Musculoskeletal:        General: No swelling, tenderness, deformity or signs of injury. Normal range of motion.     Right lower leg: No edema.     Left lower leg: No edema.  Legs:     Comments: Entire hip joint pain "Dull sore ache" Reports 5+ months  Skin:    General: Skin is warm and dry.     Capillary Refill: Capillary refill takes less than 2 seconds.     Coloration: Skin is not jaundiced or pale.     Findings: No bruising, erythema, lesion or rash.       Neurological:     General: No focal deficit present.     Mental Status: She is alert and oriented to person, place, and time. Mental status is at baseline.     Cranial Nerves: No cranial nerve deficit.     Sensory: No sensory deficit.     Motor: No weakness.     Coordination: Coordination normal.  Psychiatric:        Mood and Affect: Mood normal.        Behavior: Behavior normal.        Thought Content: Thought content normal.        Judgment: Judgment normal.     Results for  orders placed or performed in visit on 10/16/21  POCT urinalysis dipstick  Result Value Ref Range   Color, UA yellow    Clarity, UA clear    Glucose, UA Negative Negative   Bilirubin, UA negative    Ketones, UA negative    Spec Grav, UA 1.010 1.010 - 1.025   Blood, UA non hemolyzed trace    pH, UA 6.0 5.0 - 8.0   Protein, UA Negative Negative   Urobilinogen, UA 0.2 0.2 or 1.0 E.U./dL   Nitrite, UA positive    Leukocytes, UA Moderate (2+) (A) Negative   Appearance     Odor      Assessment & Plan     Problem List Items Addressed This Visit       Cardiovascular and Mediastinum   Aortic atherosclerosis (HCC)    Seen on low dose CT scan Continue statin medication        Respiratory   Centrilobular emphysema (HCC)    Seen as mild on low dose CT scan Continue daily inhaler use Recent cough Negative cough at urgent care Continue OTC therapies as likely viral; reassurance provided        Genitourinary   Cystitis    Report of bladder spasms Hx of mixed incontinence r/t viral cough UA positive Abx started Pt advised that Abx may need to be adjusted as Cx returns- voiced understanding      Relevant Medications   sulfamethoxazole-trimethoprim (BACTRIM DS) 800-160 MG tablet     Other   Fatigue    Unclear cause -negative work up for GI bleed; hx of normal colonoscopy; reports that stool color change has resolved -negative COVID -not interested in FLU vaccination; 'doesn't get the flu' education provided -repeat additional labs; pt educated that some vitamin labs may not be covered well by insurance -fatigue may be viral/linked to cough --> patient insistent to seek answer      Relevant Orders   Comprehensive metabolic panel   TSH + free T4   Hemoglobin A1c   Vitamin D (25 hydroxy)   Iron, TIBC and Ferritin Panel   Vitamin B6   B12 and Folate Panel   Left hip pain - Primary    Possibly multifocal Pt reports lots of seated time d/t adopted daughter's  extracurricular activities Will hold off on XR at this time Appears to be piriformis on exam Generalized complaints of a dull, sore, ache Negative  straight leg raise No limited range of motion Additional literature provided      Relevant Medications   meloxicam (MOBIC) 15 MG tablet   cyclobenzaprine (FLEXERIL) 5 MG tablet   Other Relevant Orders   Ambulatory referral to Vascular Surgery   Left groin pain    No evidence of rash No evidence of inguinal hernia Reports that leg feels heavy Reports that leg is 'blue/purple' if she crosses it 'funny' Hx of smoking Second opinion at [facility]; no skin changes, edema, pulse changes on exam today Exercise provided to assist with L hip      Relevant Orders   Ambulatory referral to Vascular Surgery   Mixed incontinence    Chronic, exacerbated by viral cough Refused referral to urology at this time      Relevant Orders   Urine Culture   POCT urinalysis dipstick (Completed)     Return in about 4 weeks (around 11/13/2021) for chonic disease management.      Leilani Merl, FNP, have reviewed all documentation for this visit. The documentation on 10/16/21 for the exam, diagnosis, procedures, and orders are all accurate and complete.    Jacky Kindle, FNP  [facility] [phone] (phone) [phone] (fax)  [facility]

## 2021-10-16 NOTE — Assessment & Plan Note (Addendum)
Chronic, exacerbated by viral cough Refused referral to urology at this time

## 2021-10-16 NOTE — Assessment & Plan Note (Signed)
Report of bladder spasms Hx of mixed incontinence r/t viral cough UA positive Abx started Pt advised that Abx may need to be adjusted as Cx returns- voiced understanding

## 2021-10-16 NOTE — Assessment & Plan Note (Signed)
No evidence of rash No evidence of inguinal hernia Reports that leg feels heavy Reports that leg is 'blue/purple' if she crosses it 'funny' Hx of smoking Second opinion at [facility]; no skin changes, edema, pulse changes on exam today Exercise provided to assist with L hip

## 2021-10-16 NOTE — Assessment & Plan Note (Signed)
Possibly multifocal Pt reports lots of seated time d/t adopted daughter's extracurricular activities Will hold off on XR at this time Appears to be piriformis on exam Generalized complaints of a dull, sore, ache Negative straight leg raise No limited range of motion Additional literature provided

## 2021-10-16 NOTE — Assessment & Plan Note (Signed)
Seen on low dose CT scan Continue statin medication

## 2021-10-19 LAB — URINE CULTURE

## 2021-10-20 LAB — COMPREHENSIVE METABOLIC PANEL
ALT: 19 IU/L (ref 0–32)
AST: 24 IU/L (ref 0–40)
Albumin/Globulin Ratio: 2.1 (ref 1.2–2.2)
Albumin: 4.6 g/dL (ref 3.8–4.8)
Alkaline Phosphatase: 92 IU/L (ref 44–121)
BUN/Creatinine Ratio: 31 — ABNORMAL HIGH (ref 12–28)
BUN: 22 mg/dL (ref 8–27)
Bilirubin Total: 0.4 mg/dL (ref 0.0–1.2)
CO2: 22 mmol/L (ref 20–29)
Calcium: 9.9 mg/dL (ref 8.7–10.3)
Chloride: 105 mmol/L (ref 96–106)
Creatinine, Ser: 0.7 mg/dL (ref 0.57–1.00)
Globulin, Total: 2.2 g/dL (ref 1.5–4.5)
Glucose: 102 mg/dL — ABNORMAL HIGH (ref 70–99)
Potassium: 4.6 mmol/L (ref 3.5–5.2)
Sodium: 142 mmol/L (ref 134–144)
Total Protein: 6.8 g/dL (ref 6.0–8.5)
eGFR: 95 mL/min/{1.73_m2} (ref >59)

## 2021-10-20 LAB — B12 AND FOLATE PANEL
Folate: 9.7 ng/mL (ref >3.0)
Vitamin B-12: >2000 pg/mL — ABNORMAL HIGH (ref 232–1245)

## 2021-10-20 LAB — IRON,TIBC AND FERRITIN PANEL
Ferritin: 80 ng/mL (ref 15–150)
Iron Saturation: 28 % (ref 15–55)
Iron: 95 ug/dL (ref 27–139)
Total Iron Binding Capacity: 341 ug/dL (ref 250–450)
UIBC: 246 ug/dL (ref 118–369)

## 2021-10-20 LAB — VITAMIN B6: Vitamin B6: 10.1 ug/L (ref 3.4–65.2)

## 2021-10-20 LAB — TSH+FREE T4
Free T4: 1.16 ng/dL (ref 0.82–1.77)
TSH: 3.3 u[IU]/mL (ref 0.450–4.500)

## 2021-10-20 LAB — VITAMIN D 25 HYDROXY (VIT D DEFICIENCY, FRACTURES): Vit D, 25-Hydroxy: 64.2 ng/mL (ref 30.0–100.0)

## 2021-10-20 LAB — HEMOGLOBIN A1C
Est. average glucose Bld gHb Est-mCnc: 131 mg/dL
Hgb A1c MFr Bld: 6.2 % — ABNORMAL HIGH (ref 4.8–5.6)

## 2021-10-24 ENCOUNTER — Other Ambulatory Visit: Payer: Self-pay | Admitting: Family Medicine

## 2021-10-24 DIAGNOSIS — E785 Hyperlipidemia, unspecified: Secondary | ICD-10-CM

## 2021-10-24 DIAGNOSIS — R32 Unspecified urinary incontinence: Secondary | ICD-10-CM

## 2021-10-29 ENCOUNTER — Encounter (INDEPENDENT_AMBULATORY_CARE_PROVIDER_SITE_OTHER): Payer: Self-pay | Admitting: Vascular Surgery

## 2021-10-29 ENCOUNTER — Ambulatory Visit (INDEPENDENT_AMBULATORY_CARE_PROVIDER_SITE_OTHER): Payer: Medicare HMO | Admitting: Vascular Surgery

## 2021-10-29 ENCOUNTER — Other Ambulatory Visit: Payer: Self-pay

## 2021-10-29 VITALS — BP 112/71 | HR 85 | Ht 60.0 in | Wt 139.0 lb

## 2021-10-29 DIAGNOSIS — I700 Atherosclerosis of aorta: Secondary | ICD-10-CM

## 2021-10-29 DIAGNOSIS — J432 Centrilobular emphysema: Secondary | ICD-10-CM

## 2021-10-29 DIAGNOSIS — I872 Venous insufficiency (chronic) (peripheral): Secondary | ICD-10-CM | POA: Diagnosis not present

## 2021-10-29 DIAGNOSIS — M545 Low back pain, unspecified: Secondary | ICD-10-CM

## 2021-10-29 DIAGNOSIS — M79605 Pain in left leg: Secondary | ICD-10-CM | POA: Diagnosis not present

## 2021-10-30 ENCOUNTER — Other Ambulatory Visit: Payer: Self-pay | Admitting: Family Medicine

## 2021-10-30 ENCOUNTER — Telehealth: Payer: Self-pay | Admitting: Family Medicine

## 2021-10-30 DIAGNOSIS — Z1231 Encounter for screening mammogram for malignant neoplasm of breast: Secondary | ICD-10-CM

## 2021-10-30 NOTE — Telephone Encounter (Signed)
There is a mammogram report from 10/28/2021 scanned into chart. Has this been reviewed by a provider?

## 2021-10-30 NOTE — Telephone Encounter (Signed)
Patient inuring about her imaging results

## 2021-10-31 NOTE — Telephone Encounter (Signed)
Mammogram is normal - repeat in 1 year

## 2021-11-02 NOTE — Telephone Encounter (Signed)
Patient advised.

## 2021-11-04 ENCOUNTER — Encounter (INDEPENDENT_AMBULATORY_CARE_PROVIDER_SITE_OTHER): Payer: Self-pay | Admitting: Vascular Surgery

## 2021-11-04 DIAGNOSIS — I872 Venous insufficiency (chronic) (peripheral): Secondary | ICD-10-CM | POA: Insufficient documentation

## 2021-11-04 NOTE — Progress Notes (Signed)
MRN : [id number]  Erika Smith is a 66 y.o. (May 09, 1956) female who presents with chief complaint of left leg pain.  History of Present Illness:   The patient is seen for evaluation of painful lower extremities. Patient notes the pain is variable and not always associated with activity.  She notes that the pain seems to be prior dominantly in the left groin area but does radiate toward the foot.  The pain is somewhat consistent day to day occurring on most days. The patient notes the pain also occurs with sitting and routinely seems worse as the day wears on.  She describes the pain as a progressive feeling of discomfort.a sharp stabbing pain.  The pain has been progressive over the past several years. The patient states these symptoms are causing  a profound negative impact on quality of life and daily activities.  The patient denies rest pain or dangling of an extremity off the side of the bed during the night for relief. No open wounds or sores at this time. No history of DVT or phlebitis. No prior interventions or surgeries.  There is a  history of back problems and DJD of the lumbar and sacral spine.    Current Meds  Medication Sig   atorvastatin (LIPITOR) 20 MG tablet TAKE 1 TABLET BY MOUTH EVERY DAY   cyclobenzaprine (FLEXERIL) 5 MG tablet Take 1 tablet (5 mg total) by mouth 3 (three) times daily as needed for muscle spasms.   meloxicam (MOBIC) 15 MG tablet Take 1 tablet (15 mg total) by mouth daily.   Multiple Vitamins-Minerals (MULTIVITAMIN ADULT PO) Take 1 tablet by mouth daily.   oxybutynin (DITROPAN-XL) 5 MG 24 hr tablet TAKE 1 TABLET BY MOUTH EVERYDAY AT BEDTIME   pantoprazole (PROTONIX) 20 MG tablet Take by mouth.   sulfamethoxazole-trimethoprim (BACTRIM DS) 800-160 MG tablet Take 1 tablet by mouth 2 (two) times daily.   Tiotropium Bromide-Olodaterol 2.5-2.5 MCG/ACT AERS Inhale 2 puffs into the lungs daily.    No past medical history on file.  Past Surgical  History:  Procedure Laterality Date   ABDOMINAL HYSTERECTOMY  1982   BSO   APPENDECTOMY  1982   CARDIAC CATHETERIZATION     11/2007   CHOLECYSTECTOMY  2010   MR Brain, Brain Stem  05/09/2007    Normal MRI.  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 Bluford Kaufmann. LA Grade A reflux esophagitis.    Social History Social History   Tobacco Use   Smoking status: Former    Packs/day: 1.00    Years: 33.00    Pack years: 33.00    Types: Cigarettes    Start date: 1976    Quit date: 2008    Years since quitting: 15.1   Smokeless tobacco: Never  Vaping Use   Vaping Use: Former   Devices: E cigarette - quit in 2008  Substance Use Topics   Alcohol use: No   Drug use: No    Family History Family History  Problem Relation Age of Onset   Stroke Mother    Diabetes Mother    Diabetes Mellitus II Father    Stroke Father    Heart disease Father    Congestive Heart Failure Father    Allergies Sister    Asthma Sister    Diabetes Brother    Heart disease Brother    Leukemia Other  Heart disease Sister        Had triple bypass   Cancer - Other Sister        Cancer    Allergies  Allergen Reactions   Codeine    Morphine    Percocet  [Oxycodone-Acetaminophen]    Tramadol Hcl      REVIEW OF SYSTEMS (Negative unless checked)  Constitutional: [] Weight loss  [] Fever  [] Chills Cardiac: [] Chest pain   [] Chest pressure   [] Palpitations   [] Shortness of breath when laying flat   [] Shortness of breath with exertion. Vascular:  [] Pain in legs with walking   [] Pain in legs at rest  [] History of DVT   [] Phlebitis   [] Swelling in legs   [] Varicose veins   [] Non-healing ulcers Pulmonary:   [] Uses home oxygen   [] Productive cough   [] Hemoptysis   [] Wheeze  [] COPD   [] Asthma Neurologic:  [] Dizziness   [] Seizures   [] History of stroke   [] History of TIA  [] Aphasia   [] Vissual changes   [] Weakness  or numbness in arm   [] Weakness or numbness in leg Musculoskeletal:   [] Joint swelling   [] Joint pain   [] Low back pain Hematologic:  [] Easy bruising  [] Easy bleeding   [] Hypercoagulable state   [] Anemic Gastrointestinal:  [] Diarrhea   [] Vomiting  [] Gastroesophageal reflux/heartburn   [] Difficulty swallowing. Genitourinary:  [] Chronic kidney disease   [] Difficult urination  [] Frequent urination   [] Blood in urine Skin:  [] Rashes   [] Ulcers  Psychological:  [] History of anxiety   []  History of major depression.  Physical Examination  Vitals:   10/29/21 1003  BP: 112/71  Pulse: 85  Weight: 139 lb (63 kg)  Height: 5' (1.524 m)   Body mass index is 27.15 kg/m. Gen: WD/WN, NAD Head: [place]/AT, No temporalis wasting.  Ear/Nose/Throat: Hearing grossly intact, nares w/o erythema or drainage Eyes: PER, EOMI, sclera nonicteric.  Neck: Supple, no masses.  No bruit or JVD.  Pulmonary:  Good air movement, no audible wheezing, no use of accessory muscles.  Cardiac: RRR, normal S1, S2, no Murmurs. Vascular:  scattered varicosities present bilaterally.  Mild venous stasis changes to the legs bilaterally.  2-3+ soft pitting edema  Vessel Right Left  Radial Palpable Palpable  PT Trace palpable Trace palpable  DP Trace palpable Trace palpable  Gastrointestinal: soft, non-distended. No guarding/no peritoneal signs.  Musculoskeletal: M/S 5/5 throughout.  No visible deformity.  Neurologic: CN 2-12 intact. Pain and light touch intact in extremities.  Symmetrical.  Speech is fluent. Motor exam as listed above. Psychiatric: Judgment intact, Mood & affect appropriate for pt's clinical situation. Dermatologic: No rashes or ulcers noted.  No changes consistent with cellulitis.   CBC Lab Results  Component Value Date   WBC 5.4 12/03/2020   HGB 13.0 12/03/2020   HCT 38.5 12/03/2020   MCV 94 12/03/2020   PLT 244 12/03/2020    BMET    Component Value Date/Time   NA 142 10/16/2021 1105   K 4.6  10/16/2021 1105   CL 105 10/16/2021 1105   CO2 22 10/16/2021 1105   GLUCOSE 102 (H) 10/16/2021 1105   BUN 22 10/16/2021 1105   CREATININE 0.70 10/16/2021 1105   CALCIUM 9.9 10/16/2021 1105   GFRNONAA 92 09/19/2019 1126   GFRAA 106 09/19/2019 1126   Estimated Creatinine Clearance: 56.6 mL/min (by C-G formula based on SCr of 0.7 mg/dL).  COAG No results found for: INR, PROTIME  Radiology No results found.   Assessment/Plan 1. Pain of left lower  extremity  Recommend:  The patient has atypical pain symptoms for pure atherosclerotic disease. However, on physical exam there is evidence of mixed venous and arterial disease, given the diminished pulses and the edema associated with venous changes of the legs.  Noninvasive studies including ABI's and venous ultrasound of the legs will be obtained and the patient will follow up with me to review these studies.  I suspect the patient is c/o pseudoclaudication.  Patient should have an evaluation of his LS spine which I defer to the primary service.  The patient should continue walking and begin a more formal exercise program. The patient should continue his antiplatelet therapy and aggressive treatment of the lipid abnormalities.  The patient should begin wearing graduated compression socks 15-20 mmHg strength to control edema.   - VAS [place] ABI WITH/WO TBI; Future - VAS [place] LOWER EXTREMITY VENOUS REFLUX; Future  2. Aortic atherosclerosis (HCC)  Recommend:  The patient has atypical pain symptoms for pure atherosclerotic disease. However, on physical exam there is evidence of mixed venous and arterial disease, given the diminished pulses and the edema associated with venous changes of the legs.  Noninvasive studies including ABI's and venous ultrasound of the legs will be obtained and the patient will follow up with me to review these studies.  I suspect the patient is c/o pseudoclaudication.  Patient should have an evaluation of his LS  spine which I defer to the primary service.  The patient should continue walking and begin a more formal exercise program. The patient should continue his antiplatelet therapy and aggressive treatment of the lipid abnormalities.  The patient should begin wearing graduated compression socks 15-20 mmHg strength to control edema.   - VAS [place] ABI WITH/WO TBI; Future  3. Chronic venous insufficiency I have had a long discussion with the patient regarding swelling and why it  causes symptoms.  Patient will begin wearing graduated compression stockings class 1 (20-30 mmHg) on a daily basis a prescription was given. The patient will  beginning wearing the stockings first thing in the morning and removing them in the evening. The patient is instructed specifically not to sleep in the stockings.   In addition, behavioral modification will be initiated.  This will include frequent elevation, use of over the counter pain medications and exercise such as walking.  I have reviewed systemic causes for chronic edema such as liver, kidney and cardiac etiologies.  The patient denies problems with these organ systems.    Consideration for a lymph pump will also be made based upon the effectiveness of conservative therapy.  This would help to improve the edema control and prevent sequela such as ulcers and infections   Patient should undergo duplex ultrasound of the venous system to ensure that DVT or reflux is not present.  The patient will follow-up with me after the ultrasound.   - VAS [place] LOWER EXTREMITY VENOUS REFLUX; Future  4. Left low back pain, unspecified chronicity, unspecified whether sciatica present I am suspicious that degenerative disease of the lower back is the primary cause for her lower extremity discomfort.  Continue NSAID medications as already ordered, these medications have been reviewed and there are no changes at this time.  Continued activity and therapy was stressed.   5.  Centrilobular emphysema (HCC) Continue pulmonary medications and aerosols as already ordered, these medications have been reviewed and there are no changes at this time.    Levora Dredge, MD  11/04/2021 10:40 AM

## 2021-11-12 ENCOUNTER — Other Ambulatory Visit: Payer: Self-pay | Admitting: Family Medicine

## 2021-11-12 DIAGNOSIS — M25552 Pain in left hip: Secondary | ICD-10-CM

## 2021-11-12 NOTE — Telephone Encounter (Signed)
Requested Prescriptions  Pending Prescriptions Disp Refills   meloxicam (MOBIC) 15 MG tablet [Pharmacy Med Name: MELOXICAM 15 MG TABLET] 30 tablet 0    Sig: TAKE 1 TABLET (15 MG TOTAL) BY MOUTH DAILY.     Analgesics:  COX2 Inhibitors Failed - 11/12/2021  1:44 AM      Failed - Manual Review: Labs are only required if the patient has taken medication for more than 8 weeks.      Passed - HGB in normal range and within 360 days    Hemoglobin  Date Value Ref Range Status  12/03/2020 13.0 11.1 - 15.9 g/dL Final         Passed - Cr in normal range and within 360 days    Creatinine, Ser  Date Value Ref Range Status  10/16/2021 0.70 0.57 - 1.00 mg/dL Final         Passed - HCT in normal range and within 360 days    Hematocrit  Date Value Ref Range Status  12/03/2020 38.5 34.0 - 46.6 % Final         Passed - AST in normal range and within 360 days    AST  Date Value Ref Range Status  10/16/2021 24 0 - 40 IU/L Final         Passed - ALT in normal range and within 360 days    ALT  Date Value Ref Range Status  10/16/2021 19 0 - 32 IU/L Final         Passed - eGFR is 30 or above and within 360 days    GFR calc Af Amer  Date Value Ref Range Status  09/19/2019 106 >59 mL/min/1.73 Final   GFR calc non Af Amer  Date Value Ref Range Status  09/19/2019 92 >59 mL/min/1.73 Final   eGFR  Date Value Ref Range Status  10/16/2021 95 >59 mL/min/1.73 Final         Passed - Patient is not pregnant      Passed - Valid encounter within last 12 months    Recent Outpatient Visits          3 weeks ago Left hip pain   [facility] Gwyneth Sprout, FNP   11 months ago Aortic atherosclerosis [facility])   [facility] Birdie Sons, MD   1 year ago Cough   [facility] Carles Collet M, [place]   2 years ago Lymphadenopathy   [facility] Jerrol Banana., MD   2 years ago Chest pain, unspecified type   [facility]  Birdie Sons, MD

## 2021-11-25 ENCOUNTER — Other Ambulatory Visit: Payer: Self-pay | Admitting: Family Medicine

## 2021-12-03 ENCOUNTER — Telehealth (INDEPENDENT_AMBULATORY_CARE_PROVIDER_SITE_OTHER): Payer: Self-pay | Admitting: Vascular Surgery

## 2021-12-03 NOTE — Telephone Encounter (Signed)
Pt. Came in with paperwork for an appointment with the name Erika Smith on it. I checked the schedule and no appointment was scheduled for the name [name].  I offered her an appointment for tomorrow and she declined. Stating she would call back to schedule. ?

## 2021-12-09 ENCOUNTER — Telehealth (INDEPENDENT_AMBULATORY_CARE_PROVIDER_SITE_OTHER): Payer: Self-pay | Admitting: Vascular Surgery

## 2021-12-09 NOTE — Telephone Encounter (Signed)
LVM for pt in regards to the appt mixup last week. (See previous note) Asking her to call us back so I may gather information. Nothing further needed at this time.  ?

## 2021-12-09 NOTE — Telephone Encounter (Signed)
Spoke with pt regarding appt and paperwork mixup. She detailed out what happened and it will be documented in Safety Portal as soon as all information is obtained. Nothing further needed at this time.  ?

## 2021-12-14 ENCOUNTER — Other Ambulatory Visit: Payer: Self-pay | Admitting: Family Medicine

## 2021-12-14 DIAGNOSIS — M25552 Pain in left hip: Secondary | ICD-10-CM

## 2021-12-17 ENCOUNTER — Other Ambulatory Visit: Payer: Self-pay | Admitting: *Deleted

## 2021-12-17 DIAGNOSIS — Z87891 Personal history of nicotine dependence: Secondary | ICD-10-CM

## 2021-12-28 ENCOUNTER — Other Ambulatory Visit: Payer: Self-pay | Admitting: Family Medicine

## 2021-12-28 DIAGNOSIS — R0609 Other forms of dyspnea: Secondary | ICD-10-CM

## 2021-12-28 DIAGNOSIS — J432 Centrilobular emphysema: Secondary | ICD-10-CM

## 2021-12-30 ENCOUNTER — Ambulatory Visit
Admission: RE | Admit: 2021-12-30 | Discharge: 2021-12-30 | Disposition: A | Discharge status: Home / Self Care | Payer: Medicare HMO | Source: Ambulatory Visit | Attending: Acute Care | Admitting: Acute Care

## 2021-12-30 ENCOUNTER — Other Ambulatory Visit: Payer: Self-pay

## 2021-12-30 DIAGNOSIS — J439 Emphysema, unspecified: Secondary | ICD-10-CM | POA: Diagnosis not present

## 2021-12-30 DIAGNOSIS — Z87891 Personal history of nicotine dependence: Secondary | ICD-10-CM | POA: Diagnosis not present

## 2021-12-30 DIAGNOSIS — Z122 Encounter for screening for malignant neoplasm of respiratory organs: Secondary | ICD-10-CM | POA: Insufficient documentation

## 2021-12-30 DIAGNOSIS — I700 Atherosclerosis of aorta: Secondary | ICD-10-CM | POA: Insufficient documentation

## 2021-12-30 DIAGNOSIS — I2510 Atherosclerotic heart disease of native coronary artery without angina pectoris: Secondary | ICD-10-CM | POA: Insufficient documentation

## 2022-01-01 ENCOUNTER — Encounter: Payer: Self-pay | Admitting: Family Medicine

## 2022-01-01 DIAGNOSIS — I2510 Atherosclerotic heart disease of native coronary artery without angina pectoris: Secondary | ICD-10-CM | POA: Insufficient documentation

## 2022-01-17 ENCOUNTER — Other Ambulatory Visit: Payer: Self-pay | Admitting: Family Medicine

## 2022-01-17 DIAGNOSIS — M25552 Pain in left hip: Secondary | ICD-10-CM

## 2022-01-29 ENCOUNTER — Other Ambulatory Visit: Payer: Self-pay | Admitting: Family Medicine

## 2022-01-29 ENCOUNTER — Ambulatory Visit: Payer: Medicare HMO | Admitting: Family Medicine

## 2022-01-29 ENCOUNTER — Telehealth: Payer: Self-pay

## 2022-01-29 DIAGNOSIS — R0609 Other forms of dyspnea: Secondary | ICD-10-CM

## 2022-01-29 DIAGNOSIS — J432 Centrilobular emphysema: Secondary | ICD-10-CM

## 2022-01-29 NOTE — Telephone Encounter (Signed)
Copied from CRM [id number]. Topic: Medical Record Request - Other ?>> Jan 29, 2022  1:55 PM Elliot Gault wrote: ?Reason for CRM: Caller requesting last colonoscopy report, please note when fax request is received ?

## 2022-04-23 ENCOUNTER — Other Ambulatory Visit: Payer: Self-pay | Admitting: Family Medicine

## 2022-04-23 DIAGNOSIS — R32 Unspecified urinary incontinence: Secondary | ICD-10-CM

## 2022-05-07 ENCOUNTER — Other Ambulatory Visit: Payer: Self-pay | Admitting: Internal Medicine

## 2022-05-07 DIAGNOSIS — R1310 Dysphagia, unspecified: Secondary | ICD-10-CM

## 2022-05-13 ENCOUNTER — Inpatient Hospital Stay: Admission: RE | Admit: 2022-05-13 | Payer: Medicare HMO | Source: Ambulatory Visit

## 2022-06-02 ENCOUNTER — Ambulatory Visit
Admission: RE | Admit: 2022-06-02 | Discharge: 2022-06-02 | Disposition: A | Discharge status: Home / Self Care | Payer: Medicare HMO | Source: Ambulatory Visit | Attending: Internal Medicine | Admitting: Internal Medicine

## 2022-06-02 DIAGNOSIS — R1310 Dysphagia, unspecified: Secondary | ICD-10-CM | POA: Insufficient documentation

## 2022-07-19 ENCOUNTER — Encounter (INDEPENDENT_AMBULATORY_CARE_PROVIDER_SITE_OTHER): Payer: Self-pay

## 2022-08-06 ENCOUNTER — Other Ambulatory Visit: Payer: Self-pay | Admitting: Internal Medicine

## 2022-08-06 ENCOUNTER — Ambulatory Visit
Admission: RE | Admit: 2022-08-06 | Discharge: 2022-08-06 | Disposition: A | Discharge status: Home / Self Care | Payer: Medicare HMO | Source: Ambulatory Visit | Attending: Internal Medicine | Admitting: Internal Medicine

## 2022-08-06 DIAGNOSIS — M79622 Pain in left upper arm: Secondary | ICD-10-CM

## 2022-08-06 DIAGNOSIS — M25512 Pain in left shoulder: Secondary | ICD-10-CM | POA: Insufficient documentation

## 2022-09-19 ENCOUNTER — Emergency Department: Payer: Medicare HMO

## 2022-09-19 ENCOUNTER — Other Ambulatory Visit: Payer: Self-pay

## 2022-09-19 ENCOUNTER — Inpatient Hospital Stay
Admission: EM | Admit: 2022-09-19 | Discharge: 2022-09-21 | DRG: 563 | Disposition: A | Discharge status: Other Acute Inpatient Hospital | Payer: Medicare HMO | Attending: Orthopedic Surgery | Admitting: Orthopedic Surgery

## 2022-09-19 DIAGNOSIS — Z791 Long term (current) use of non-steroidal anti-inflammatories (NSAID): Secondary | ICD-10-CM | POA: Diagnosis not present

## 2022-09-19 DIAGNOSIS — S82142A Displaced bicondylar fracture of left tibia, initial encounter for closed fracture: Secondary | ICD-10-CM | POA: Diagnosis present

## 2022-09-19 DIAGNOSIS — Z792 Long term (current) use of antibiotics: Secondary | ICD-10-CM

## 2022-09-19 DIAGNOSIS — S82422A Displaced transverse fracture of shaft of left fibula, initial encounter for closed fracture: Secondary | ICD-10-CM | POA: Diagnosis present

## 2022-09-19 DIAGNOSIS — Z885 Allergy status to narcotic agent status: Secondary | ICD-10-CM

## 2022-09-19 DIAGNOSIS — S82202A Unspecified fracture of shaft of left tibia, initial encounter for closed fracture: Secondary | ICD-10-CM | POA: Diagnosis present

## 2022-09-19 DIAGNOSIS — Z825 Family history of asthma and other chronic lower respiratory diseases: Secondary | ICD-10-CM | POA: Diagnosis not present

## 2022-09-19 DIAGNOSIS — Z823 Family history of stroke: Secondary | ICD-10-CM | POA: Diagnosis not present

## 2022-09-19 DIAGNOSIS — S82252A Displaced comminuted fracture of shaft of left tibia, initial encounter for closed fracture: Secondary | ICD-10-CM | POA: Diagnosis present

## 2022-09-19 DIAGNOSIS — K219 Gastro-esophageal reflux disease without esophagitis: Secondary | ICD-10-CM | POA: Diagnosis present

## 2022-09-19 DIAGNOSIS — E785 Hyperlipidemia, unspecified: Secondary | ICD-10-CM | POA: Diagnosis present

## 2022-09-19 DIAGNOSIS — Y93K1 Activity, walking an animal: Secondary | ICD-10-CM

## 2022-09-19 DIAGNOSIS — S82452A Displaced comminuted fracture of shaft of left fibula, initial encounter for closed fracture: Secondary | ICD-10-CM | POA: Diagnosis not present

## 2022-09-19 DIAGNOSIS — Z833 Family history of diabetes mellitus: Secondary | ICD-10-CM

## 2022-09-19 DIAGNOSIS — W010XXA Fall on same level from slipping, tripping and stumbling without subsequent striking against object, initial encounter: Secondary | ICD-10-CM | POA: Diagnosis present

## 2022-09-19 DIAGNOSIS — Z79899 Other long term (current) drug therapy: Secondary | ICD-10-CM | POA: Diagnosis not present

## 2022-09-19 DIAGNOSIS — Z87891 Personal history of nicotine dependence: Secondary | ICD-10-CM

## 2022-09-19 DIAGNOSIS — R7303 Prediabetes: Secondary | ICD-10-CM | POA: Diagnosis present

## 2022-09-19 DIAGNOSIS — Z8249 Family history of ischemic heart disease and other diseases of the circulatory system: Secondary | ICD-10-CM

## 2022-09-19 HISTORY — DX: Gastro-esophageal reflux disease without esophagitis: K21.9

## 2022-09-19 HISTORY — DX: Unspecified urinary incontinence: R32

## 2022-09-19 HISTORY — DX: Hyperlipidemia, unspecified: E78.5

## 2022-09-19 LAB — COMPREHENSIVE METABOLIC PANEL
ALT: 20 U/L (ref 0–44)
AST: 26 U/L (ref 15–41)
Albumin: 4.8 g/dL (ref 3.5–5.0)
Alkaline Phosphatase: 85 U/L (ref 38–126)
Anion gap: 8 (ref 5–15)
BUN: 32 mg/dL — ABNORMAL HIGH (ref 8–23)
CO2: 27 mmol/L (ref 22–32)
Calcium: 10.1 mg/dL (ref 8.9–10.3)
Chloride: 106 mmol/L (ref 98–111)
Creatinine, Ser: 0.71 mg/dL (ref 0.44–1.00)
GFR, Estimated: >60 mL/min (ref >60)
Glucose, Bld: 149 mg/dL — ABNORMAL HIGH (ref 70–99)
Potassium: 4.2 mmol/L (ref 3.5–5.1)
Sodium: 141 mmol/L (ref 135–145)
Total Bilirubin: 0.5 mg/dL (ref 0.3–1.2)
Total Protein: 8.4 g/dL — ABNORMAL HIGH (ref 6.5–8.1)

## 2022-09-19 LAB — CBC
HCT: 41.2 % (ref 36.0–46.0)
Hemoglobin: 13.5 g/dL (ref 12.0–15.0)
MCH: 31.6 pg (ref 26.0–34.0)
MCHC: 32.8 g/dL (ref 30.0–36.0)
MCV: 96.5 fL (ref 80.0–100.0)
Platelets: 287 K/uL (ref 150–400)
RBC: 4.27 MIL/uL (ref 3.87–5.11)
RDW: 12.5 % (ref 11.5–15.5)
WBC: 9.1 K/uL (ref 4.0–10.5)
nRBC: 0 % (ref 0.0–0.2)

## 2022-09-19 LAB — PROTIME-INR
INR: 1 (ref 0.8–1.2)
Prothrombin Time: 13.6 seconds (ref 11.4–15.2)

## 2022-09-19 LAB — TYPE AND SCREEN
ABO/RH(D): A POS
Antibody Screen: NEGATIVE

## 2022-09-19 LAB — APTT: aPTT: 23 seconds — ABNORMAL LOW (ref 24–36)

## 2022-09-19 MED ORDER — BISACODYL 5 MG PO TBEC: 5.0000 mg | DELAYED_RELEASE_TABLET | Freq: Every day | ORAL | Status: DC | PRN

## 2022-09-19 MED ORDER — METHOCARBAMOL 1000 MG/10ML IJ SOLN: 500.0000 mg | Freq: Four times a day (QID) | INTRAVENOUS | Status: DC | PRN

## 2022-09-19 MED ORDER — ENOXAPARIN SODIUM 40 MG/0.4ML IJ SOSY
40.0000 mg | PREFILLED_SYRINGE | INTRAMUSCULAR | Status: DC
  Administered 2022-09-20: 40 mg via SUBCUTANEOUS
  Filled 2022-09-19: qty 0.4

## 2022-09-19 MED ORDER — DOCUSATE SODIUM 100 MG PO CAPS
100.0000 mg | ORAL_CAPSULE | Freq: Two times a day (BID) | ORAL | Status: DC
  Administered 2022-09-19 – 2022-09-20 (×3): 100 mg via ORAL
  Filled 2022-09-19 (×3): qty 1

## 2022-09-19 MED ORDER — FENTANYL CITRATE PF 50 MCG/ML IJ SOSY
100.0000 ug | PREFILLED_SYRINGE | Freq: Once | INTRAMUSCULAR | Status: AC
  Administered 2022-09-19: 100 ug via INTRAVENOUS
  Filled 2022-09-19: qty 2

## 2022-09-19 MED ORDER — METHOCARBAMOL 500 MG PO TABS
500.0000 mg | ORAL_TABLET | Freq: Four times a day (QID) | ORAL | Status: DC | PRN
  Administered 2022-09-20 (×2): 500 mg via ORAL
  Filled 2022-09-19 (×2): qty 1

## 2022-09-19 MED ORDER — DIPHENHYDRAMINE HCL 25 MG PO CAPS: 25.0000 mg | ORAL_CAPSULE | Freq: Four times a day (QID) | ORAL | Status: DC | PRN

## 2022-09-19 MED ORDER — FENTANYL CITRATE PF 50 MCG/ML IJ SOSY
PREFILLED_SYRINGE | INTRAMUSCULAR | Status: AC
  Administered 2022-09-19: 50 ug via INTRAVENOUS
  Filled 2022-09-19: qty 1

## 2022-09-19 MED ORDER — ONDANSETRON HCL 4 MG/2ML IJ SOLN
4.0000 mg | Freq: Three times a day (TID) | INTRAMUSCULAR | Status: DC
  Administered 2022-09-19 – 2022-09-21 (×3): 4 mg via INTRAVENOUS
  Filled 2022-09-19 (×3): qty 2

## 2022-09-19 MED ORDER — SODIUM CHLORIDE 0.9 % IV SOLN
12.5000 mg | Freq: Four times a day (QID) | INTRAVENOUS | Status: DC | PRN
  Administered 2022-09-20: 12.5 mg via INTRAVENOUS
  Filled 2022-09-19: qty 12.5

## 2022-09-19 MED ORDER — FENTANYL CITRATE PF 50 MCG/ML IJ SOSY
50.0000 ug | PREFILLED_SYRINGE | INTRAMUSCULAR | Status: AC | PRN
  Administered 2022-09-19: 50 ug via INTRAVENOUS
  Filled 2022-09-19: qty 1

## 2022-09-19 MED ORDER — SODIUM CHLORIDE 0.9 % IV SOLN: INTRAVENOUS | Status: DC

## 2022-09-19 MED ORDER — ACETAMINOPHEN 325 MG PO TABS: 650.0000 mg | ORAL_TABLET | Freq: Four times a day (QID) | ORAL | Status: DC | PRN

## 2022-09-19 MED ORDER — PROMETHAZINE HCL 25 MG PO TABS
12.5000 mg | ORAL_TABLET | Freq: Four times a day (QID) | ORAL | Status: DC | PRN
  Administered 2022-09-19: 12.5 mg via ORAL
  Filled 2022-09-19: qty 1

## 2022-09-19 MED ORDER — ENOXAPARIN SODIUM 40 MG/0.4ML IJ SOSY: 40.0000 mg | PREFILLED_SYRINGE | INTRAMUSCULAR | Status: DC

## 2022-09-19 MED ORDER — SENNOSIDES-DOCUSATE SODIUM 8.6-50 MG PO TABS: 1.0000 | ORAL_TABLET | Freq: Every evening | ORAL | Status: DC | PRN

## 2022-09-19 MED ORDER — HYDROMORPHONE HCL 2 MG PO TABS: 2.0000 mg | ORAL_TABLET | ORAL | Status: DC | PRN

## 2022-09-19 MED ORDER — HYDROMORPHONE HCL 1 MG/ML IJ SOLN
0.5000 mg | INTRAMUSCULAR | Status: DC | PRN
  Administered 2022-09-19: 1 mg via INTRAVENOUS
  Filled 2022-09-19: qty 1

## 2022-09-19 NOTE — ED Triage Notes (Signed)
First Nurse Note:  Pt via EMS from home. Pt was carrying boxes and her dogs ran up and made her leg buckle. Pt c/o L lower leg and swelling. Denies LOC. Denies head injury. Pt is A&OX4 and NAD 18 G L AC, EMS 4mg  of Zofran and of Fentanyl  139/98 BO  101 HR  99% on RA 263 CBG

## 2022-09-19 NOTE — ED Triage Notes (Signed)
Pt to ED for mechanical fall. This RN uncovered leg and left lower leg has an obvious deformity.   Pt is CAOx4 and in obvious discomfort.

## 2022-09-19 NOTE — H&P (Signed)
PREOPERATIVE H&P  Chief Complaint:  Left knee pain s/p fall.    HPI: Erika Smith is a 66 y.o. female who presents to the [facility] ED today after being knocked over by her dogs. Patient fell with her left leg flexed under her.  Patient states she had immediate pain and knew right away something she had a significant injury. She was unable to weightbear after the injury.  Patient brought to the ED via EMS.  Xrays revealed comminuted fractures of the left tibial plateau and fibular head/neck.  Orthopaedics was consulted for management of the patient's fracture.  Patient was seen and evaluated by me in room 1 of the ED with family members in the room.  I ordered a CT scan of the patient's leg for better evaluation of the patient's fractures.  She was placed in a knee immobilizer by the ED staff.   No past medical history on file. Past Surgical History:  Procedure Laterality Date   ABDOMINAL HYSTERECTOMY  1982   BSO   APPENDECTOMY  1982   CARDIAC CATHETERIZATION     11/2007   CHOLECYSTECTOMY  2010   MR Brain, Brain Stem  05/09/2007    Normal MRI.  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 Bluford Kaufmann. LA Grade A reflux esophagitis.   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  Tobacco Use   Smoking status: Former    Packs/day: 1.00    Years: 33.00    Total pack years: 33.00    Types: Cigarettes    Start date: 1976    Quit date: 2008    Years since quitting: 16.0   Smokeless tobacco: Never  Vaping Use   Vaping Use: Former   Devices: E cigarette - quit in 2008  Substance and Sexual Activity   Alcohol use: No    Drug use: No   Sexual activity: Not on file  Other Topics Concern   Not on file  Social History Narrative   Not on file   Social Determinants of Health   Financial Resource Strain: Low Risk  (10/08/2021)   Overall Financial Resource Strain (CARDIA)    Difficulty of Paying Living Expenses: Not hard at all  Food Insecurity: No Food Insecurity (10/08/2021)   Hunger Vital Sign    Worried About Running Out of Food in the Last Year: Never true    Ran Out of Food in the Last Year: Never true  Transportation Needs: No Transportation Needs (10/08/2021)   PRAPARE - Administrator, Civil Service (Medical): No    Lack of Transportation (Non-Medical): No  Physical Activity: Insufficiently Active (10/08/2021)   Exercise Vital Sign    Days of Exercise per Week: 3 days    Minutes of Exercise per Session: 20 min  Stress: No Stress Concern Present (10/08/2021)   [employer] of Occupational Health - Occupational Stress Questionnaire    Feeling of Stress : Not at all  Social Connections: Moderately Integrated (10/08/2021)   Social Connection and Isolation Panel [NHANES]    Frequency of Communication with Friends and Family:  More than three times a week    Frequency of Social Gatherings with Friends and Family: Once a week    Attends Religious Services: More than 4 times per year    Active Member of [employer] or Organizations: No    Attends Engineer, structural: Never    Marital Status: Married   Family History  Problem Relation Age of Onset   Stroke Mother    Diabetes Mother    Diabetes Mellitus II Father    Stroke Father    Heart disease Father    Congestive Heart Failure Father    Allergies Sister    Asthma Sister    Diabetes Brother    Heart disease Brother    Leukemia Other    Heart disease Sister        Had triple bypass   Cancer - Other Sister        Cancer   Allergies  Allergen Reactions   Codeine    Morphine    Percocet  [Oxycodone-Acetaminophen]     Tramadol Hcl    Prior to Admission medications   Medication Sig Start Date End Date Taking? Authorizing Provider  atorvastatin (LIPITOR) 20 MG tablet TAKE 1 TABLET BY MOUTH EVERY DAY 10/25/21   Malva Limes, MD  cyclobenzaprine (FLEXERIL) 5 MG tablet Take 1 tablet (5 mg total) by mouth 3 (three) times daily as needed for muscle spasms. 10/16/21   Jacky Kindle, FNP  meloxicam (MOBIC) 15 MG tablet Take 1 tablet (15 mg total) by mouth daily as needed for pain. 01/17/22   Malva Limes, MD  Multiple Vitamins-Minerals (MULTIVITAMIN ADULT PO) Take 1 tablet by mouth daily.    [provider]  oxybutynin (DITROPAN-XL) 5 MG 24 hr tablet TAKE 1 TABLET BY MOUTH EVERYDAY AT BEDTIME 04/23/22   Malva Limes, MD  pantoprazole (PROTONIX) 20 MG tablet Take by mouth. 02/04/21 02/04/22  [provider]  STIOLTO RESPIMAT 2.5-2.5 MCG/ACT AERS INHALE 2 PUFFS BY MOUTH INTO THE LUNGS DAILY 01/29/22   Malva Limes, MD  sulfamethoxazole-trimethoprim (BACTRIM DS) 800-160 MG tablet Take 1 tablet by mouth 2 (two) times daily. 10/16/21   Jacky Kindle, FNP     Positive ROS: All other systems have been reviewed and were otherwise negative with the exception of those mentioned in the HPI and as above.  Physical Exam: General: Alert, no acute distress Cardiovascular: Regular rate and rhythm, no murmurs rubs or gallops.  No pedal edema Respiratory: Clear to auscultation bilaterally, no wheezes rales or rhonchi. No cyanosis, no use of accessory musculature GI: No organomegaly, abdomen is soft and non-tender nondistended with positive bowel sounds. Skin: Skin intact, no lesions within the operative field. Neurologic: Sensation intact distally Psychiatric: Patient is competent for consent with normal mood and affect Lymphatic: No cervical lymphadenopathy  MUSCULOSKELETAL: Left lower extremity:  Patient's knee immobilizer was opened to examine the left lower leg.  Patient has intact skin.  There is no  significant ecchymosis.  No erythema.  Patient has moderate focal swelling over the proximal leg, but her leg compartments are soft and compressible. There is no obvious deformity.  She has intact sensation to light touch, but states she feels faint paresthsias in her foot.  She can flex and extend her toes and dosiflex and plantarflex her ankle with ROM limited by pain.    Assessment: Left closed tibial plateau and fibular head fractures  Plan: I have personally reviewed the patient's xrays and CT scan.  I have discussed this case with Dr. Myrene Galas, orthopaedic traumatologist at [facility].  He has agreed to perform the patient's definitive surgery.  I am admitting the patient for neurovascular monitoring and pain control tonight.  Patient will be transferred to [facility] hopefully tomorrow for possible surgery on Tuesday or Wednesday.   I formed the patient and her family that she has a complex fracture of her left proximal tibia and fibula.  I explained to them that she will require surgical fixation of her fracture.  Patient dates that she is having significant pain in her left leg but there are no signs of compartment syndrome at this time.  Patient will be admitted for pain control and neurovascular monitoring.  Patient has allergies to several narcotic pain medications and will receive p.o. and IV Dilaudid for pain control.  I personally adjusted the patient's knee immobilizer for a more supportive fit.  Patient will continue left lower extremity elevation.  She is nonweightbearing on the left lower extremity.  Patient was started on Lovenox for DVT prophylaxis tomorrow assuming that she does not develop significant left lower leg swelling overnight.  I answered all her questions today.  She understood and agreed with the above-noted plan.    Juanell Fairly, MD   09/19/2022 8:05 PM

## 2022-09-19 NOTE — Consult Note (Signed)
Medical Consultation   Erika Smith  ZOX:[id number]  DOB: 1955-12-27  DOA: 09/19/2022  PCP: Louis Matte, MD  Outpatient Specialists: *** [facility] speciality and name if known)   Requesting physician: ***  Reason for consultation: ***   History of Present Illness: Erika Smith is an 66 y.o. female *** (The initial 2-3 lines should be focused and good to copy and paste in the HPI section of the daily progress note).  (Please avoid self-populating past medical history here)  (For level 3, the HPI must include 4+ descriptors: Location, Quality, Severity, Duration, Timing, Context, modifying factors, associated signs/symptoms and/or status of 3+ chronic problems.)       Review of Systems:   As per HPI otherwise 10 point review of systems negative.  Unacceptable ROS statements: "10 systems reviewed," "Extensive" (without elaboration).  Acceptable ROS statements: "All others negative," "All others reviewed and are negative," and "All others unremarkable," with at LEAST ONE ROS documented Can't double dip - if using for HPI can't use for ROS  Review of Systems Past Medical History: No past medical history on file.  Past Surgical History: Past Surgical History:  Procedure Laterality Date   ABDOMINAL HYSTERECTOMY  1982   BSO   APPENDECTOMY  1982   CARDIAC CATHETERIZATION     11/2007   CHOLECYSTECTOMY  2010   MR Brain, Brain Stem  05/09/2007    Normal MRI.  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 Bluford Kaufmann. LA Grade A reflux esophagitis.     Allergies:   Allergies  Allergen Reactions   Codeine    Morphine    Percocet  [Oxycodone-Acetaminophen]    Tramadol Hcl      Social History:  reports that she quit smoking about 16 years ago. Her smoking use included cigarettes. She started smoking about 48 years ago. She has a 33.00  pack-year smoking history. She has never used smokeless tobacco. She reports that she does not drink alcohol and does not use drugs.   Family History: Family History  Problem Relation Age of Onset   Stroke Mother    Diabetes Mother    Diabetes Mellitus II Father    Stroke Father    Heart disease Father    Congestive Heart Failure Father    Allergies Sister    Asthma Sister    Diabetes Brother    Heart disease Brother    Leukemia Other    Heart disease Sister        Had triple bypass   Cancer - Other Sister        Cancer    Unacceptable: Noncontributory, unremarkable, or negative. Acceptable: Family history reviewed and not pertinent (If you reviewed it)   Physical Exam: Vitals:   09/19/22 1830 09/19/22 2056 09/19/22 2132 09/19/22 2200  BP: (!) 142/59  (!) 100/52 (!) 125/57  Pulse: 81 94 89 78  Resp:  15 14 (!) 9  Temp:   97.9 F (36.6 C)   TempSrc:   Oral   SpO2: 97% 93% 97% 95%  Weight:      Height:        Constitutional: Appearance,  Alert and awake, oriented x3, not in any acute distress. Eyes: PERLA, EOMI, irises appear normal, anicteric sclera,  ENMT: external ears and nose appear normal, normal  hearing or hard of hearing            Lips appears normal, oropharynx mucosa, tongue, posterior pharynx appear normal  Neck: neck appears normal, no masses, normal ROM, no thyromegaly, no JVD  CVS: S1-S2 clear, no murmur rubs or gallops, no LE edema, normal pedal pulses  Respiratory:  clear to auscultation bilaterally, no wheezing, rales or rhonchi. Respiratory effort normal. No accessory muscle use.  Abdomen: soft nontender, nondistended, normal bowel sounds, no hepatosplenomegaly, no hernias  Musculoskeletal: : no cyanosis, clubbing or edema noted bilaterally                       Joint/bones/muscle exam, strength, contractures or atrophy Neuro: Cranial nerves II-XII intact, strength, sensation, reflexes Psych: judgement and insight appear normal, stable mood and  affect, mental status Skin: no rashes or lesions or ulcers, no induration or nodules   (Anything < 9 systems with 2 bullets each down codes to level 1) (If patient refuses exam can't bill higher level) (Make sure to document decubitus ulcers present on admission -- if possible -- and whether patient has chronic indwelling catheter at time of admission)  Data reviewed:  I have personally reviewed following labs and imaging studies Labs:  CBC: Recent Labs  Lab 09/19/22 1522  WBC 9.1  HGB 13.5  HCT 41.2  MCV 96.5  PLT 287    Basic Metabolic Panel: Recent Labs  Lab 09/19/22 1522  NA 141  K 4.2  CL 106  CO2 27  GLUCOSE 149*  BUN 32*  CREATININE 0.71  CALCIUM 10.1   GFR Estimated Creatinine Clearance: 55.8 mL/min (by C-G formula based on SCr of 0.71 mg/dL). Liver Function Tests: Recent Labs  Lab 09/19/22 1522  AST 26  ALT 20  ALKPHOS 85  BILITOT 0.5  PROT 8.4*  ALBUMIN 4.8   No results for input(s): "LIPASE", "AMYLASE" in the last 168 hours. No results for input(s): "AMMONIA" in the last 168 hours. Coagulation profile Recent Labs  Lab 09/19/22 2037  INR 1.0    Cardiac Enzymes: No results for input(s): "CKTOTAL", "CKMB", "CKMBINDEX", "TROPONINI" in the last 168 hours. BNP: Invalid input(s): "POCBNP" CBG: No results for input(s): "GLUCAP" in the last 168 hours. D-Dimer No results for input(s): "DDIMER" in the last 72 hours. Hgb A1c No results for input(s): "HGBA1C" in the last 72 hours. Lipid Profile No results for input(s): "CHOL", "HDL", "LDLCALC", "TRIG", "CHOLHDL", "LDLDIRECT" in the last 72 hours. Thyroid function studies No results for input(s): "TSH", "T4TOTAL", "T3FREE", "THYROIDAB" in the last 72 hours.  Invalid input(s): "FREET3" Anemia work up No results for input(s): "VITAMINB12", "FOLATE", "FERRITIN", "TIBC", "IRON", "RETICCTPCT" in the last 72 hours. Urinalysis    Component Value Date/Time   BILIRUBINUR negative 10/16/2021 1101    PROTEINUR Negative 10/16/2021 1101   UROBILINOGEN 0.2 10/16/2021 1101   NITRITE positive 10/16/2021 1101   LEUKOCYTESUR Moderate (2+) (A) 10/16/2021 1101     Sepsis Labs Recent Labs  Lab 09/19/22 1522  WBC 9.1   Microbiology No results found for this or any previous visit (from the past 240 hour(s)).     Inpatient Medications:   Scheduled Meds:  docusate sodium  100 mg Oral BID   [START ON 09/20/2022] enoxaparin (LOVENOX) injection  40 mg Subcutaneous Q24H   ondansetron (ZOFRAN) IV  4 mg Intravenous Q8H   Continuous Infusions:  sodium chloride 75 mL/hr at 09/19/22 2136   methocarbamol (ROBAXIN) IV     promethazine (PHENERGAN)  injection (IM or IVPB)       Radiological Exams on Admission: CT KNEE LEFT WO CONTRAST  Result Date: 09/19/2022 CLINICAL DATA:  Knee trauma.  Lateral tibial plateau fracture. EXAM: CT OF THE LEFT KNEE WITHOUT CONTRAST TECHNIQUE: Multidetector CT imaging of the left knee was performed according to the standard protocol. Multiplanar CT image reconstructions were also generated. RADIATION DOSE REDUCTION: This exam was performed according to the departmental dose-optimization program which includes automated exposure control, adjustment of the mA and/or kV according to patient size and/or use of iterative reconstruction technique. COMPARISON:  Left knee radiographs 09/19/2022 FINDINGS: Bones/Joint/Cartilage There is mildly decreased bone mineralization. There is a markedly comminuted acute fracture of the proximal lateral tibia with fracture line extending from the lateral tibial spine inferiorly and laterally to the lateral proximal metaphyseal and epiphyseal tibial cortex and inferiorly and medially to the far medial proximal tibial metaphyseal cortex. There is up to approximately 5 mm diastasis of the proximal lateral tibial fracture line. 2 mm lateral cortical step-off at the far anterolateral aspect of the proximal tibia (coronal series 8, image 61) and up to  11 mm lateral cortical step-off at the mid AP dimension of the proximal tibia (coronal series 8, image 71). At the proximal medial tibial metaphysis, there is up to 3 mm medial cortical step-off of the proximal fracture component posteriorly (coronal series 8, image 79) and up to 7 mm lateral cortical step-off of the distal fracture component anteriorly (coronal series 8, image 58). There is a subtle nondisplaced fracture line also seen within the medial tibial spine (coronal images 73 through 80). No tibial plateau depression is visualized. There is a markedly comminuted fracture of the proximal fibula extending from the proximal cortex through the proximal fibular metaphysis. Up to 9 mm posterior cortical step-off posteriorly with mild posterior and medial angulation of the proximal posterior fracture fragment (coronal images 98 through 110 and sagittal image 100). Mild-to-moderate medial compartment of the knee joint space narrowing with mild peripheral degenerative osteophytes. Mild superior patellar degenerative osteophytosis. Ligaments Suboptimally assessed by CT. Muscles and Tendons Normal size and density of the regional musculature. No gross tendon tear is seen. Soft tissues There is a moderate joint effusion with layering dense hematocrit. There is moderate soft tissue swelling of the anterior proximal calf at the level of the tibial fractures. IMPRESSION: 1. Markedly comminuted and mildly displaced fracture lines involving the predominantly proximal lateral tibia extending through the medial proximal tibial metaphyseal cortex. 2. Markedly comminuted and mildly displaced acute fracture of the proximal fibula. 3. Moderate joint effusion with layering dense hematocrit. Electronically Signed   By: Neita Garnet M.D.   On: 09/19/2022 18:08   DG Tibia/Fibula Left Port  Result Date: 09/19/2022 CLINICAL DATA:  Injury. Fall. Dog ran into leg. Deformity noted at proximal tibia/fibula. EXAM: LEFT KNEE -  COMPLETE 4+ VIEW; PORTABLE LEFT ANKLE - 2 VIEW; PORTABLE LEFT TIBIA AND FIBULA - 2 VIEW COMPARISON:  None Available. FINDINGS: Left knee: There is a comminuted fracture of the proximal fibula extending through the metaphysis with mild displacement of the comminuted components, and up to 6 mm lateral cortical step-off, 8 mm posterior cortical step-off, and mild anterior apex angulation of the fracture. There is a transverse to oblique, mildly comminuted fracture of the proximal tibia, extending from the proximal lateral tibial physis and metadiaphysis through the proximal medial tibial metaphysis. 3 mm medial cortical step-off and 6 mm posterior cortical step-off. Mild medial knee compartment joint space narrowing  and peripheral osteophytosis. No knee joint effusion. -- Left tibia and fibula: Likely vascular phleboliths overlie the posterior calf. -- Left ankle: The ankle mortise is symmetric and intact. Minimal chronic enthesopathic change at the Achilles insertion on the calcaneus. Subtle lucency measuring only approximately 1 mm in length at the far medial aspect of the medial malleolus, suspicious for an acute nondisplaced fracture. IMPRESSION: 1. Comminuted, mildly displaced, and angulated fracture of the proximal fibula. 2. Mildly comminuted acute fracture of the proximal tibia, extending from the proximal lateral tibial physis and metadiaphysis through the proximal medial tibial metaphysis. 3. Probable tiny 1 mm linear nondisplaced acute fracture at the far medial aspect of the medial malleolus. Recommend clinical correlation for point tenderness. Electronically Signed   By: Neita Garnetonald  Viola M.D.   On: 09/19/2022 16:46   DG Ankle Left Port  Result Date: 09/19/2022 CLINICAL DATA:  Injury. Fall. Dog ran into leg. Deformity noted at proximal tibia/fibula. EXAM: LEFT KNEE - COMPLETE 4+ VIEW; PORTABLE LEFT ANKLE - 2 VIEW; PORTABLE LEFT TIBIA AND FIBULA - 2 VIEW COMPARISON:  None Available. FINDINGS: Left knee:  There is a comminuted fracture of the proximal fibula extending through the metaphysis with mild displacement of the comminuted components, and up to 6 mm lateral cortical step-off, 8 mm posterior cortical step-off, and mild anterior apex angulation of the fracture. There is a transverse to oblique, mildly comminuted fracture of the proximal tibia, extending from the proximal lateral tibial physis and metadiaphysis through the proximal medial tibial metaphysis. 3 mm medial cortical step-off and 6 mm posterior cortical step-off. Mild medial knee compartment joint space narrowing and peripheral osteophytosis. No knee joint effusion. -- Left tibia and fibula: Likely vascular phleboliths overlie the posterior calf. -- Left ankle: The ankle mortise is symmetric and intact. Minimal chronic enthesopathic change at the Achilles insertion on the calcaneus. Subtle lucency measuring only approximately 1 mm in length at the far medial aspect of the medial malleolus, suspicious for an acute nondisplaced fracture. IMPRESSION: 1. Comminuted, mildly displaced, and angulated fracture of the proximal fibula. 2. Mildly comminuted acute fracture of the proximal tibia, extending from the proximal lateral tibial physis and metadiaphysis through the proximal medial tibial metaphysis. 3. Probable tiny 1 mm linear nondisplaced acute fracture at the far medial aspect of the medial malleolus. Recommend clinical correlation for point tenderness. Electronically Signed   By: Neita Garnetonald  Viola M.D.   On: 09/19/2022 16:46   DG Knee Complete 4 Views Left  Result Date: 09/19/2022 CLINICAL DATA:  Injury. Fall. Dog ran into leg. Deformity noted at proximal tibia/fibula. EXAM: LEFT KNEE - COMPLETE 4+ VIEW; PORTABLE LEFT ANKLE - 2 VIEW; PORTABLE LEFT TIBIA AND FIBULA - 2 VIEW COMPARISON:  None Available. FINDINGS: Left knee: There is a comminuted fracture of the proximal fibula extending through the metaphysis with mild displacement of the comminuted  components, and up to 6 mm lateral cortical step-off, 8 mm posterior cortical step-off, and mild anterior apex angulation of the fracture. There is a transverse to oblique, mildly comminuted fracture of the proximal tibia, extending from the proximal lateral tibial physis and metadiaphysis through the proximal medial tibial metaphysis. 3 mm medial cortical step-off and 6 mm posterior cortical step-off. Mild medial knee compartment joint space narrowing and peripheral osteophytosis. No knee joint effusion. -- Left tibia and fibula: Likely vascular phleboliths overlie the posterior calf. -- Left ankle: The ankle mortise is symmetric and intact. Minimal chronic enthesopathic change at the Achilles insertion on the calcaneus. Subtle lucency  measuring only approximately 1 mm in length at the far medial aspect of the medial malleolus, suspicious for an acute nondisplaced fracture. IMPRESSION: 1. Comminuted, mildly displaced, and angulated fracture of the proximal fibula. 2. Mildly comminuted acute fracture of the proximal tibia, extending from the proximal lateral tibial physis and metadiaphysis through the proximal medial tibial metaphysis. 3. Probable tiny 1 mm linear nondisplaced acute fracture at the far medial aspect of the medial malleolus. Recommend clinical correlation for point tenderness. Electronically Signed   By: Neita Garnet M.D.   On: 09/19/2022 16:46    Impression/Recommendations Principal Problem:   Closed fracture of left tibial plateau     Thank you for this consultation.  Our [facility] hospitalist team will follow the patient with you.   Time Spent: ***  Debe Coder M.D. [facility] 09/19/2022, 11:54 PM

## 2022-09-19 NOTE — ED Provider Notes (Signed)
[facility] Provider Note    Event Date/Time   First MD Initiated Contact with Patient 09/19/22 1612     (approximate)   History   Leg Injury (Obvious deformities)   HPI  Erika Smith is a 66 y.o. female who presents to the emergency department today because of concerns for left leg pain.  Patient states that she was walking her dogs when they hit her left leg and she fell with her leg behind her.  She is now having severe pain from her knee down.  The patient denies any other injuries.  Denies any head injury.     Physical Exam   Triage Vital Signs: ED Triage Vitals  Enc Vitals Group     BP 09/19/22 1517 130/82     Pulse Rate 09/19/22 1517 93     Resp 09/19/22 1517 16     Temp 09/19/22 1517 98.1 F (36.7 C)     Temp Source 09/19/22 1517 Oral     SpO2 09/19/22 1517 98 %     Weight 09/19/22 1518 138 lb 14.2 oz (63 kg)     Height 09/19/22 1518 5' (1.524 m)     Head Circumference --      Peak Flow --      Pain Score 09/19/22 1517 10     Pain Loc --      Pain Edu? --      Excl. in GC? --     Most recent vital signs: Vitals:   09/19/22 1517  BP: 130/82  Pulse: 93  Resp: 16  Temp: 98.1 F (36.7 C)  SpO2: 98%   General: Awake, alert, oriented. CV:  Good peripheral perfusion.  Resp:  Normal effort.  Abd:  No distention.  Other:  Left leg with swelling and deformity to the proximal shin. NV intact distally with strong DP.    ED Results / Procedures / Treatments   Labs (all labs ordered are listed, but only abnormal results are displayed) Labs Reviewed  COMPREHENSIVE METABOLIC PANEL - Abnormal; Notable for the following components:      Result Value   Glucose, Bld 149 (*)    BUN 32 (*)    Total Protein 8.4 (*)    All other components within normal limits  CBC  TYPE AND SCREEN  TYPE AND SCREEN     RADIOLOGY I independently interpreted and visualized the left tib/fib/left knee/left ankle. My interpretation: proximal  tib/fib fracture Radiology interpretation:  IMPRESSION:  1. Comminuted, mildly displaced, and angulated fracture of the  proximal fibula.  2. Mildly comminuted acute fracture of the proximal tibia, extending  from the proximal lateral tibial physis and metadiaphysis through  the proximal medial tibial metaphysis.  3. Probable tiny 1 mm linear nondisplaced acute fracture at the far  medial aspect of the medial malleolus. Recommend clinical  correlation for point tenderness.   PROCEDURES:  Critical Care performed: No  Procedures   MEDICATIONS ORDERED IN ED: Medications  fentaNYL (SUBLIMAZE) injection 50 mcg (50 mcg Intravenous Given 09/19/22 1528)  fentaNYL (SUBLIMAZE) 50 MCG/ML injection (has no administration in time range)  fentaNYL (SUBLIMAZE) injection 100 mcg (100 mcg Intravenous Given 09/19/22 1621)     IMPRESSION / MDM / ASSESSMENT AND PLAN / ED COURSE  I reviewed the triage vital signs and the nursing notes.  Differential diagnosis includes, but is not limited to, fracture, dislocation, contusion.  Patient's presentation is most consistent with acute presentation with potential threat to life or bodily function.  Patient presented to the emergency department today because of concerns for left leg pain after a fall.  On exam patient does have some deformity and tenderness to the proximal shin.  X-rays do show a proximal tib-fib fracture.  Discussed with Dr. Martha Clan with the orthopedic service who will plan on admission.  FINAL CLINICAL IMPRESSION(S) / ED DIAGNOSES   Final diagnoses:  Tibia/fibula fracture, left, closed, initial encounter     Note:  This document was prepared using Dragon voice recognition software and may include unintentional dictation errors.    Phineas Semen, MD 09/19/22 2038

## 2022-09-19 NOTE — ED Notes (Signed)
+   csm of left foot.

## 2022-09-20 ENCOUNTER — Encounter: Payer: Self-pay | Admitting: Orthopedic Surgery

## 2022-09-20 DIAGNOSIS — S82142A Displaced bicondylar fracture of left tibia, initial encounter for closed fracture: Secondary | ICD-10-CM

## 2022-09-20 LAB — CBC
HCT: 31.2 % — ABNORMAL LOW (ref 36.0–46.0)
Hemoglobin: 10.3 g/dL — ABNORMAL LOW (ref 12.0–15.0)
MCH: 31.5 pg (ref 26.0–34.0)
MCHC: 33 g/dL (ref 30.0–36.0)
MCV: 95.4 fL (ref 80.0–100.0)
Platelets: 217 10*3/uL (ref 150–400)
RBC: 3.27 MIL/uL — ABNORMAL LOW (ref 3.87–5.11)
RDW: 12.6 % (ref 11.5–15.5)
WBC: 7 10*3/uL (ref 4.0–10.5)
nRBC: 0 % (ref 0.0–0.2)

## 2022-09-20 LAB — BASIC METABOLIC PANEL
Anion gap: 9 (ref 5–15)
BUN: 27 mg/dL — ABNORMAL HIGH (ref 8–23)
CO2: 23 mmol/L (ref 22–32)
Calcium: 8.4 mg/dL — ABNORMAL LOW (ref 8.9–10.3)
Chloride: 104 mmol/L (ref 98–111)
Creatinine, Ser: 0.72 mg/dL (ref 0.44–1.00)
GFR, Estimated: >60 mL/min (ref >60)
Glucose, Bld: 106 mg/dL — ABNORMAL HIGH (ref 70–99)
Potassium: 3.8 mmol/L (ref 3.5–5.1)
Sodium: 136 mmol/L (ref 135–145)

## 2022-09-20 MED ORDER — ACETAMINOPHEN 10 MG/ML IV SOLN
1000.0000 mg | Freq: Three times a day (TID) | INTRAVENOUS | Status: AC
  Administered 2022-09-20 (×3): 1000 mg via INTRAVENOUS
  Filled 2022-09-20 (×4): qty 100

## 2022-09-20 MED ORDER — PANTOPRAZOLE SODIUM 40 MG PO TBEC
40.0000 mg | DELAYED_RELEASE_TABLET | Freq: Every day | ORAL | Status: DC
  Administered 2022-09-20: 40 mg via ORAL
  Filled 2022-09-20: qty 1

## 2022-09-20 MED ORDER — PROCHLORPERAZINE EDISYLATE 10 MG/2ML IJ SOLN
10.0000 mg | Freq: Four times a day (QID) | INTRAMUSCULAR | Status: DC | PRN
  Administered 2022-09-20: 10 mg via INTRAVENOUS
  Filled 2022-09-20: qty 2

## 2022-09-20 MED ORDER — SCOPOLAMINE 1 MG/3DAYS TD PT72
1.0000 | MEDICATED_PATCH | TRANSDERMAL | Status: DC
  Administered 2022-09-20: 1.5 mg via TRANSDERMAL
  Filled 2022-09-20: qty 1

## 2022-09-20 MED ORDER — ATORVASTATIN CALCIUM 20 MG PO TABS
20.0000 mg | ORAL_TABLET | Freq: Every day | ORAL | Status: DC
  Administered 2022-09-20: 20 mg via ORAL
  Filled 2022-09-20: qty 1

## 2022-09-20 MED ORDER — FENTANYL CITRATE PF 50 MCG/ML IJ SOSY
50.0000 ug | PREFILLED_SYRINGE | INTRAMUSCULAR | Status: DC | PRN
  Administered 2022-09-20: 50 ug via INTRAVENOUS
  Filled 2022-09-20 (×2): qty 1

## 2022-09-20 MED ORDER — HYDROCODONE-ACETAMINOPHEN 5-325 MG PO TABS
1.0000 | ORAL_TABLET | ORAL | Status: DC | PRN
  Administered 2022-09-20 – 2022-09-21 (×3): 2 via ORAL
  Filled 2022-09-20 (×3): qty 2

## 2022-09-20 MED ORDER — FENTANYL CITRATE PF 50 MCG/ML IJ SOSY
25.0000 ug | PREFILLED_SYRINGE | INTRAMUSCULAR | Status: DC | PRN
  Administered 2022-09-20: 25 ug via INTRAVENOUS
  Filled 2022-09-20: qty 1

## 2022-09-20 NOTE — Plan of Care (Signed)

## 2022-09-20 NOTE — Progress Notes (Signed)
Subjective:  Patient sleeping comfortably when I saw her today.  She was easily arousable.  Patient denies any significant pain in the left lower extremity at rest.  Objective:   VITALS:   Vitals:   09/20/22 0530 09/20/22 0730 09/20/22 0848 09/20/22 1559  BP: (!) 95/54 (!) 102/45 (!) 103/49 (!) 112/55  Pulse: 60 64 68 79  Resp: 15 16 18 18   Temp:   98.2 F (36.8 C) 98.6 F (37 C)  TempSrc:      SpO2: 100% 100% 95% 94%  Weight:      Height:        PHYSICAL EXAM: Left lower extremity: Knee immobilizer is in place.  Her left leg compartments are soft compressible.  She has palpable pedal pulses and intact sensation to light touch throughout her left lower extremity.  She can gently dorsiflex and plantarflex her ankle and flex and extend her toes.   LABS  Results for orders placed or performed during the hospital encounter of 09/19/22 (from the past 24 hour(s))  Type and screen Ordered by PROVIDER DEFAULT     Status: None   Collection Time: 09/19/22  5:56 PM  Result Value Ref Range   ABO/RH(D) A POS    Antibody Screen NEG    Sample Expiration      09/22/2022,2359 Performed at [facility], [address] [place], [place]   Protime-INR     Status: None   Collection Time: 09/19/22  8:37 PM  Result Value Ref Range   Prothrombin Time 13.6 11.4 - 15.2 seconds   INR 1.0 0.8 - 1.2  APTT     Status: Abnormal   Collection Time: 09/19/22  8:37 PM  Result Value Ref Range   aPTT 23 (L) 24 - 36 seconds  CBC     Status: Abnormal   Collection Time: 09/20/22  9:08 AM  Result Value Ref Range   WBC 7.0 4.0 - 10.5 K/uL   RBC 3.27 (L) 3.87 - 5.11 MIL/uL   Hemoglobin 10.3 (L) 12.0 - 15.0 g/dL   HCT 11/19/22 (L) 41.9 - 37.9 %   MCV 95.4 80.0 - 100.0 fL   MCH 31.5 26.0 - 34.0 pg   MCHC 33.0 30.0 - 36.0 g/dL   RDW 02.4 09.7 - 35.3 %   Platelets 217 150 - 400 K/uL   nRBC 0.0 0.0 - 0.2 %  Basic metabolic panel     Status: Abnormal   Collection Time: 09/20/22  9:08 AM   Result Value Ref Range   Sodium 136 135 - 145 mmol/L   Potassium 3.8 3.5 - 5.1 mmol/L   Chloride 104 98 - 111 mmol/L   CO2 23 22 - 32 mmol/L   Glucose, Bld 106 (H) 70 - 99 mg/dL   BUN 27 (H) 8 - 23 mg/dL   Creatinine, Ser 11/19/22 0.44 - 1.00 mg/dL   Calcium 8.4 (L) 8.9 - 10.3 mg/dL   GFR, Estimated 2.42 >68 mL/min   Anion gap 9 5 - 15    CT KNEE LEFT WO CONTRAST  Result Date: 09/19/2022 CLINICAL DATA:  Knee trauma.  Lateral tibial plateau fracture. EXAM: CT OF THE LEFT KNEE WITHOUT CONTRAST TECHNIQUE: Multidetector CT imaging of the left knee was performed according to the standard protocol. Multiplanar CT image reconstructions were also generated. RADIATION DOSE REDUCTION: This exam was performed according to the departmental dose-optimization program which includes automated exposure control, adjustment of the mA and/or kV according to patient size and/or  use of iterative reconstruction technique. COMPARISON:  Left knee radiographs 09/19/2022 FINDINGS: Bones/Joint/Cartilage There is mildly decreased bone mineralization. There is a markedly comminuted acute fracture of the proximal lateral tibia with fracture line extending from the lateral tibial spine inferiorly and laterally to the lateral proximal metaphyseal and epiphyseal tibial cortex and inferiorly and medially to the far medial proximal tibial metaphyseal cortex. There is up to approximately 5 mm diastasis of the proximal lateral tibial fracture line. 2 mm lateral cortical step-off at the far anterolateral aspect of the proximal tibia (coronal series 8, image 61) and up to 11 mm lateral cortical step-off at the mid AP dimension of the proximal tibia (coronal series 8, image 71). At the proximal medial tibial metaphysis, there is up to 3 mm medial cortical step-off of the proximal fracture component posteriorly (coronal series 8, image 79) and up to 7 mm lateral cortical step-off of the distal fracture component anteriorly (coronal series 8,  image 58). There is a subtle nondisplaced fracture line also seen within the medial tibial spine (coronal images 73 through 80). No tibial plateau depression is visualized. There is a markedly comminuted fracture of the proximal fibula extending from the proximal cortex through the proximal fibular metaphysis. Up to 9 mm posterior cortical step-off posteriorly with mild posterior and medial angulation of the proximal posterior fracture fragment (coronal images 98 through 110 and sagittal image 100). Mild-to-moderate medial compartment of the knee joint space narrowing with mild peripheral degenerative osteophytes. Mild superior patellar degenerative osteophytosis. Ligaments Suboptimally assessed by CT. Muscles and Tendons Normal size and density of the regional musculature. No gross tendon tear is seen. Soft tissues There is a moderate joint effusion with layering dense hematocrit. There is moderate soft tissue swelling of the anterior proximal calf at the level of the tibial fractures. IMPRESSION: 1. Markedly comminuted and mildly displaced fracture lines involving the predominantly proximal lateral tibia extending through the medial proximal tibial metaphyseal cortex. 2. Markedly comminuted and mildly displaced acute fracture of the proximal fibula. 3. Moderate joint effusion with layering dense hematocrit. Electronically Signed   By: Neita Garnet M.D.   On: 09/19/2022 18:08   DG Tibia/Fibula Left Port  Result Date: 09/19/2022 CLINICAL DATA:  Injury. Fall. Dog ran into leg. Deformity noted at proximal tibia/fibula. EXAM: LEFT KNEE - COMPLETE 4+ VIEW; PORTABLE LEFT ANKLE - 2 VIEW; PORTABLE LEFT TIBIA AND FIBULA - 2 VIEW COMPARISON:  None Available. FINDINGS: Left knee: There is a comminuted fracture of the proximal fibula extending through the metaphysis with mild displacement of the comminuted components, and up to 6 mm lateral cortical step-off, 8 mm posterior cortical step-off, and mild anterior apex  angulation of the fracture. There is a transverse to oblique, mildly comminuted fracture of the proximal tibia, extending from the proximal lateral tibial physis and metadiaphysis through the proximal medial tibial metaphysis. 3 mm medial cortical step-off and 6 mm posterior cortical step-off. Mild medial knee compartment joint space narrowing and peripheral osteophytosis. No knee joint effusion. -- Left tibia and fibula: Likely vascular phleboliths overlie the posterior calf. -- Left ankle: The ankle mortise is symmetric and intact. Minimal chronic enthesopathic change at the Achilles insertion on the calcaneus. Subtle lucency measuring only approximately 1 mm in length at the far medial aspect of the medial malleolus, suspicious for an acute nondisplaced fracture. IMPRESSION: 1. Comminuted, mildly displaced, and angulated fracture of the proximal fibula. 2. Mildly comminuted acute fracture of the proximal tibia, extending from the proximal lateral tibial physis  and metadiaphysis through the proximal medial tibial metaphysis. 3. Probable tiny 1 mm linear nondisplaced acute fracture at the far medial aspect of the medial malleolus. Recommend clinical correlation for point tenderness. Electronically Signed   By: Yvonne Kendall M.D.   On: 09/19/2022 16:46   DG Ankle Left Port  Result Date: 09/19/2022 CLINICAL DATA:  Injury. Fall. Dog ran into leg. Deformity noted at proximal tibia/fibula. EXAM: LEFT KNEE - COMPLETE 4+ VIEW; PORTABLE LEFT ANKLE - 2 VIEW; PORTABLE LEFT TIBIA AND FIBULA - 2 VIEW COMPARISON:  None Available. FINDINGS: Left knee: There is a comminuted fracture of the proximal fibula extending through the metaphysis with mild displacement of the comminuted components, and up to 6 mm lateral cortical step-off, 8 mm posterior cortical step-off, and mild anterior apex angulation of the fracture. There is a transverse to oblique, mildly comminuted fracture of the proximal tibia, extending from the proximal  lateral tibial physis and metadiaphysis through the proximal medial tibial metaphysis. 3 mm medial cortical step-off and 6 mm posterior cortical step-off. Mild medial knee compartment joint space narrowing and peripheral osteophytosis. No knee joint effusion. -- Left tibia and fibula: Likely vascular phleboliths overlie the posterior calf. -- Left ankle: The ankle mortise is symmetric and intact. Minimal chronic enthesopathic change at the Achilles insertion on the calcaneus. Subtle lucency measuring only approximately 1 mm in length at the far medial aspect of the medial malleolus, suspicious for an acute nondisplaced fracture. IMPRESSION: 1. Comminuted, mildly displaced, and angulated fracture of the proximal fibula. 2. Mildly comminuted acute fracture of the proximal tibia, extending from the proximal lateral tibial physis and metadiaphysis through the proximal medial tibial metaphysis. 3. Probable tiny 1 mm linear nondisplaced acute fracture at the far medial aspect of the medial malleolus. Recommend clinical correlation for point tenderness. Electronically Signed   By: Yvonne Kendall M.D.   On: 09/19/2022 16:46   DG Knee Complete 4 Views Left  Result Date: 09/19/2022 CLINICAL DATA:  Injury. Fall. Dog ran into leg. Deformity noted at proximal tibia/fibula. EXAM: LEFT KNEE - COMPLETE 4+ VIEW; PORTABLE LEFT ANKLE - 2 VIEW; PORTABLE LEFT TIBIA AND FIBULA - 2 VIEW COMPARISON:  None Available. FINDINGS: Left knee: There is a comminuted fracture of the proximal fibula extending through the metaphysis with mild displacement of the comminuted components, and up to 6 mm lateral cortical step-off, 8 mm posterior cortical step-off, and mild anterior apex angulation of the fracture. There is a transverse to oblique, mildly comminuted fracture of the proximal tibia, extending from the proximal lateral tibial physis and metadiaphysis through the proximal medial tibial metaphysis. 3 mm medial cortical step-off and 6 mm  posterior cortical step-off. Mild medial knee compartment joint space narrowing and peripheral osteophytosis. No knee joint effusion. -- Left tibia and fibula: Likely vascular phleboliths overlie the posterior calf. -- Left ankle: The ankle mortise is symmetric and intact. Minimal chronic enthesopathic change at the Achilles insertion on the calcaneus. Subtle lucency measuring only approximately 1 mm in length at the far medial aspect of the medial malleolus, suspicious for an acute nondisplaced fracture. IMPRESSION: 1. Comminuted, mildly displaced, and angulated fracture of the proximal fibula. 2. Mildly comminuted acute fracture of the proximal tibia, extending from the proximal lateral tibial physis and metadiaphysis through the proximal medial tibial metaphysis. 3. Probable tiny 1 mm linear nondisplaced acute fracture at the far medial aspect of the medial malleolus. Recommend clinical correlation for point tenderness. Electronically Signed   By: Viann Fish.D.  On: 09/19/2022 16:46    Assessment/Plan:     Principal Problem:   Closed fracture of left tibial plateau  Patient's pain is much improved today.  I appreciate the hospitalist consult overnight with patient's nausea and vomiting.  Patient is very sensitive to narcotic pain medication.  Most of them because nausea and vomiting.  Patient tolerating IV fentanyl.  I asked her she does not know whether she has received hydrocodone before.  She is willing to try this for an oral option for pain management.  She is written for Phenergan IV, Compazine, scopolamine patch and Zofran for potential nausea.  Continue Lovenox for DVT prophylaxis.  I updated Dr. Marcelino Scot the orthopedic trauma specialist at [facility] on the patient's condition.  The plan will be to transfer the patient to [facility] tomorrow for definitive surgical management.    Thornton Park , MD 09/20/2022, 5:20 PM

## 2022-09-20 NOTE — Progress Notes (Signed)
PROGRESS NOTE    Erika Smith  BTD:[id number] DOB: 06/30/1956 DOA: 09/19/2022 PCP: Louis Matte, MD    Brief Narrative:   Erika Smith is a 67 y.o. female with past medical history significant for HLD, GERD, urinary continence who presented to [facility] ED on 12/31 with left lower extremity pain following mechanical fall.  Patient reports she was tripped by her dog's and landed on a flexed knee.  Imaging notable for left tib/fib fracture.  Admitted to orthopedic service.  Patient developed severe nausea and vomiting due to multiple pain medication including codeine, morphine, oxycodone and tramadol.  Hospitalist service was consulted for assistance with pain control.  Assessment & Plan:   Closed fracture left tibial plateau and fibular head Patient presenting fall mechanical fall at home.  Imaging notable for markedly comminuted and mildly displaced fracture involving proximal lateral tibia extending through the medial proximal tibial metaphyseal cortex, and proximal fibular with moderate joint effusion.  Patient has been intolerant to multiple narcotics including codeine, morphine, oxycodone, tramadol and Dilaudid.  In which this is a longstanding issue for her. -- Tylenol 1000 mg IV every 8 hours -- Fentanyl 50 mcg IV every 2 hours as needed severe pain; could convert to fentanyl patch on discharge if needed; currently tolerating well with -- Supportive care with antiemetics, Zofran/Compazine/Phenergan -- Scopolamine patch -- NWB LLE, knee immobilizer in place, elevation LLE, on Lovenox for DVT prophylaxis -- Pending transfer to [facility] per orthopedics for surgery -- Further per orthopedics  HLD: Atorvastatin 20 mg p.o. daily  GERD: Protonix substituted for outpatient omeprazole   DVT prophylaxis: enoxaparin (LOVENOX) injection 40 mg Start: 09/20/22 1000 SCDs Start: 09/19/22 1959 Place TED hose Start: 09/19/22 1959    Code Status: Full Code Family  Communication: No family present at bedside this morning  Disposition Plan:  Level of care: Med-Surg Status is: Inpatient Remains inpatient appropriate because: Pending transfer to [facility] for definitive surgery, further per primary orthopedics    Subjective: Patient seen examined at bedside, resting comfortably.  Sleeping but easily arousable.  Pain currently controlled.  Tolerating fentanyl IV.  Awaiting transfer to [facility] for definitive surgery per orthopedics.  No other specific questions or concerns at this time.  Denies headache, no dizziness, no chest pain, no palpitations, no shortness of breath, no abdominal pain, no current nausea/vomiting, no diarrhea, no fever/chills/night sweats, no paresthesias.  No acute events overnight per nursing staff.  Objective: Vitals:   09/20/22 0315 09/20/22 0530 09/20/22 0730 09/20/22 0848  BP: (!) 105/57 (!) 95/54 (!) 102/45 (!) 103/49  Pulse: 71 60 64 68  Resp: 14 15 16 18   Temp:    98.2 F (36.8 C)  TempSrc:      SpO2: 96% 100% 100% 95%  Weight:      Height:        Intake/Output Summary (Last 24 hours) at 09/20/2022 1155 Last data filed at 09/20/2022 0529 Gross per 24 hour  Intake 305.77 ml  Output --  Net 305.77 ml   Filed Weights   09/19/22 1518  Weight: 63 kg    Examination:  Physical Exam: GEN: NAD, alert and oriented x 3, wd/wn HEENT: NCAT, PERRL, EOMI, sclera clear, MMM PULM: CTAB w/o wheezes/crackles, normal respiratory effort, on room air CV: RRR w/o M/G/R GI: abd soft, NTND, NABS, no R/G/M MSK: no peripheral edema, left lower extremity with knee immobilizer in place, neurovascular intact, otherwise moves all extremities independently NEURO: CN II-XII intact, no focal  deficits, sensation to light touch intact PSYCH: normal mood/affect Integumentary: dry/intact, no rashes or wounds    Data Reviewed: I have personally reviewed following labs and imaging studies  CBC: Recent Labs  Lab 09/19/22 1522  09/20/22 0908  WBC 9.1 7.0  HGB 13.5 10.3*  HCT 41.2 31.2*  MCV 96.5 95.4  PLT 287 381   Basic Metabolic Panel: Recent Labs  Lab 09/19/22 1522 09/20/22 0908  NA 141 136  K 4.2 3.8  CL 106 104  CO2 27 23  GLUCOSE 149* 106*  BUN 32* 27*  CREATININE 0.71 0.72  CALCIUM 10.1 8.4*   GFR: Estimated Creatinine Clearance: 55.8 mL/min (by C-G formula based on SCr of 0.72 mg/dL). Liver Function Tests: Recent Labs  Lab 09/19/22 1522  AST 26  ALT 20  ALKPHOS 85  BILITOT 0.5  PROT 8.4*  ALBUMIN 4.8   No results for input(s): "LIPASE", "AMYLASE" in the last 168 hours. No results for input(s): "AMMONIA" in the last 168 hours. Coagulation Profile: Recent Labs  Lab 09/19/22 2037  INR 1.0   Cardiac Enzymes: No results for input(s): "CKTOTAL", "CKMB", "CKMBINDEX", "TROPONINI" in the last 168 hours. BNP (last 3 results) No results for input(s): "PROBNP" in the last 8760 hours. HbA1C: No results for input(s): "HGBA1C" in the last 72 hours. CBG: No results for input(s): "GLUCAP" in the last 168 hours. Lipid Profile: No results for input(s): "CHOL", "HDL", "LDLCALC", "TRIG", "CHOLHDL", "LDLDIRECT" in the last 72 hours. Thyroid Function Tests: No results for input(s): "TSH", "T4TOTAL", "FREET4", "T3FREE", "THYROIDAB" in the last 72 hours. Anemia Panel: No results for input(s): "VITAMINB12", "FOLATE", "FERRITIN", "TIBC", "IRON", "RETICCTPCT" in the last 72 hours. Sepsis Labs: No results for input(s): "PROCALCITON", "LATICACIDVEN" in the last 168 hours.  No results found for this or any previous visit (from the past 240 hour(s)).       Radiology Studies: CT KNEE LEFT WO CONTRAST  Result Date: 09/19/2022 CLINICAL DATA:  Knee trauma.  Lateral tibial plateau fracture. EXAM: CT OF THE LEFT KNEE WITHOUT CONTRAST TECHNIQUE: Multidetector CT imaging of the left knee was performed according to the standard protocol. Multiplanar CT image reconstructions were also generated.  RADIATION DOSE REDUCTION: This exam was performed according to the departmental dose-optimization program which includes automated exposure control, adjustment of the mA and/or kV according to patient size and/or use of iterative reconstruction technique. COMPARISON:  Left knee radiographs 09/19/2022 FINDINGS: Bones/Joint/Cartilage There is mildly decreased bone mineralization. There is a markedly comminuted acute fracture of the proximal lateral tibia with fracture line extending from the lateral tibial spine inferiorly and laterally to the lateral proximal metaphyseal and epiphyseal tibial cortex and inferiorly and medially to the far medial proximal tibial metaphyseal cortex. There is up to approximately 5 mm diastasis of the proximal lateral tibial fracture line. 2 mm lateral cortical step-off at the far anterolateral aspect of the proximal tibia (coronal series 8, image 61) and up to 11 mm lateral cortical step-off at the mid AP dimension of the proximal tibia (coronal series 8, image 71). At the proximal medial tibial metaphysis, there is up to 3 mm medial cortical step-off of the proximal fracture component posteriorly (coronal series 8, image 79) and up to 7 mm lateral cortical step-off of the distal fracture component anteriorly (coronal series 8, image 58). There is a subtle nondisplaced fracture line also seen within the medial tibial spine (coronal images 73 through 80). No tibial plateau depression is visualized. There is a markedly comminuted fracture of the  proximal fibula extending from the proximal cortex through the proximal fibular metaphysis. Up to 9 mm posterior cortical step-off posteriorly with mild posterior and medial angulation of the proximal posterior fracture fragment (coronal images 98 through 110 and sagittal image 100). Mild-to-moderate medial compartment of the knee joint space narrowing with mild peripheral degenerative osteophytes. Mild superior patellar degenerative osteophytosis.  Ligaments Suboptimally assessed by CT. Muscles and Tendons Normal size and density of the regional musculature. No gross tendon tear is seen. Soft tissues There is a moderate joint effusion with layering dense hematocrit. There is moderate soft tissue swelling of the anterior proximal calf at the level of the tibial fractures. IMPRESSION: 1. Markedly comminuted and mildly displaced fracture lines involving the predominantly proximal lateral tibia extending through the medial proximal tibial metaphyseal cortex. 2. Markedly comminuted and mildly displaced acute fracture of the proximal fibula. 3. Moderate joint effusion with layering dense hematocrit. Electronically Signed   By: Yvonne Kendall M.D.   On: 09/19/2022 18:08   DG Tibia/Fibula Left Port  Result Date: 09/19/2022 CLINICAL DATA:  Injury. Fall. Dog ran into leg. Deformity noted at proximal tibia/fibula. EXAM: LEFT KNEE - COMPLETE 4+ VIEW; PORTABLE LEFT ANKLE - 2 VIEW; PORTABLE LEFT TIBIA AND FIBULA - 2 VIEW COMPARISON:  None Available. FINDINGS: Left knee: There is a comminuted fracture of the proximal fibula extending through the metaphysis with mild displacement of the comminuted components, and up to 6 mm lateral cortical step-off, 8 mm posterior cortical step-off, and mild anterior apex angulation of the fracture. There is a transverse to oblique, mildly comminuted fracture of the proximal tibia, extending from the proximal lateral tibial physis and metadiaphysis through the proximal medial tibial metaphysis. 3 mm medial cortical step-off and 6 mm posterior cortical step-off. Mild medial knee compartment joint space narrowing and peripheral osteophytosis. No knee joint effusion. -- Left tibia and fibula: Likely vascular phleboliths overlie the posterior calf. -- Left ankle: The ankle mortise is symmetric and intact. Minimal chronic enthesopathic change at the Achilles insertion on the calcaneus. Subtle lucency measuring only approximately 1 mm in  length at the far medial aspect of the medial malleolus, suspicious for an acute nondisplaced fracture. IMPRESSION: 1. Comminuted, mildly displaced, and angulated fracture of the proximal fibula. 2. Mildly comminuted acute fracture of the proximal tibia, extending from the proximal lateral tibial physis and metadiaphysis through the proximal medial tibial metaphysis. 3. Probable tiny 1 mm linear nondisplaced acute fracture at the far medial aspect of the medial malleolus. Recommend clinical correlation for point tenderness. Electronically Signed   By: Yvonne Kendall M.D.   On: 09/19/2022 16:46   DG Ankle Left Port  Result Date: 09/19/2022 CLINICAL DATA:  Injury. Fall. Dog ran into leg. Deformity noted at proximal tibia/fibula. EXAM: LEFT KNEE - COMPLETE 4+ VIEW; PORTABLE LEFT ANKLE - 2 VIEW; PORTABLE LEFT TIBIA AND FIBULA - 2 VIEW COMPARISON:  None Available. FINDINGS: Left knee: There is a comminuted fracture of the proximal fibula extending through the metaphysis with mild displacement of the comminuted components, and up to 6 mm lateral cortical step-off, 8 mm posterior cortical step-off, and mild anterior apex angulation of the fracture. There is a transverse to oblique, mildly comminuted fracture of the proximal tibia, extending from the proximal lateral tibial physis and metadiaphysis through the proximal medial tibial metaphysis. 3 mm medial cortical step-off and 6 mm posterior cortical step-off. Mild medial knee compartment joint space narrowing and peripheral osteophytosis. No knee joint effusion. -- Left tibia and fibula: Likely  vascular phleboliths overlie the posterior calf. -- Left ankle: The ankle mortise is symmetric and intact. Minimal chronic enthesopathic change at the Achilles insertion on the calcaneus. Subtle lucency measuring only approximately 1 mm in length at the far medial aspect of the medial malleolus, suspicious for an acute nondisplaced fracture. IMPRESSION: 1. Comminuted, mildly  displaced, and angulated fracture of the proximal fibula. 2. Mildly comminuted acute fracture of the proximal tibia, extending from the proximal lateral tibial physis and metadiaphysis through the proximal medial tibial metaphysis. 3. Probable tiny 1 mm linear nondisplaced acute fracture at the far medial aspect of the medial malleolus. Recommend clinical correlation for point tenderness. Electronically Signed   By: Neita Garnet M.D.   On: 09/19/2022 16:46   DG Knee Complete 4 Views Left  Result Date: 09/19/2022 CLINICAL DATA:  Injury. Fall. Dog ran into leg. Deformity noted at proximal tibia/fibula. EXAM: LEFT KNEE - COMPLETE 4+ VIEW; PORTABLE LEFT ANKLE - 2 VIEW; PORTABLE LEFT TIBIA AND FIBULA - 2 VIEW COMPARISON:  None Available. FINDINGS: Left knee: There is a comminuted fracture of the proximal fibula extending through the metaphysis with mild displacement of the comminuted components, and up to 6 mm lateral cortical step-off, 8 mm posterior cortical step-off, and mild anterior apex angulation of the fracture. There is a transverse to oblique, mildly comminuted fracture of the proximal tibia, extending from the proximal lateral tibial physis and metadiaphysis through the proximal medial tibial metaphysis. 3 mm medial cortical step-off and 6 mm posterior cortical step-off. Mild medial knee compartment joint space narrowing and peripheral osteophytosis. No knee joint effusion. -- Left tibia and fibula: Likely vascular phleboliths overlie the posterior calf. -- Left ankle: The ankle mortise is symmetric and intact. Minimal chronic enthesopathic change at the Achilles insertion on the calcaneus. Subtle lucency measuring only approximately 1 mm in length at the far medial aspect of the medial malleolus, suspicious for an acute nondisplaced fracture. IMPRESSION: 1. Comminuted, mildly displaced, and angulated fracture of the proximal fibula. 2. Mildly comminuted acute fracture of the proximal tibia, extending  from the proximal lateral tibial physis and metadiaphysis through the proximal medial tibial metaphysis. 3. Probable tiny 1 mm linear nondisplaced acute fracture at the far medial aspect of the medial malleolus. Recommend clinical correlation for point tenderness. Electronically Signed   By: Neita Garnet M.D.   On: 09/19/2022 16:46        Scheduled Meds:  docusate sodium  100 mg Oral BID   enoxaparin (LOVENOX) injection  40 mg Subcutaneous Q24H   ondansetron (ZOFRAN) IV  4 mg Intravenous Q8H   scopolamine  1 patch Transdermal Q72H   Continuous Infusions:  sodium chloride 75 mL/hr at 09/20/22 1013   acetaminophen 1,000 mg (09/20/22 1014)   methocarbamol (ROBAXIN) IV     promethazine (PHENERGAN) injection (IM or IVPB) Stopped (09/20/22 0303)     LOS: 1 day    Time spent: 39 minutes spent on chart review, discussion with nursing staff, consultants, updating family and interview/physical exam; more than 50% of that time was spent in counseling and/or coordination of care.    Alvira Philips [place], DO [facility] Available via Epic secure chat 7am-7pm After these hours, please refer to coverage provider listed on [email or web address] 09/20/2022, 11:55 AM

## 2022-09-20 NOTE — Progress Notes (Signed)
PT Cancellation Note  Patient Details Name: PATRIECE ARCHBOLD MRN: [id number] DOB: 12/04/1955   Cancelled Treatment:     Pt is still under bed rest order and she is awaiting transfer to [facility] for surgery and she should not be mobilized per communication with attending. Will hold pending further communication from physician and removal of bedrest order.   Bradly Chris PT, DPT  09/20/2022, 3:40 PM

## 2022-09-21 ENCOUNTER — Inpatient Hospital Stay (HOSPITAL_COMMUNITY): Payer: Medicare HMO | Admitting: Anesthesiology

## 2022-09-21 ENCOUNTER — Inpatient Hospital Stay: Payer: Medicare HMO

## 2022-09-21 ENCOUNTER — Other Ambulatory Visit: Payer: Self-pay

## 2022-09-21 ENCOUNTER — Inpatient Hospital Stay (HOSPITAL_COMMUNITY)
Admission: RE | Admit: 2022-09-21 | Discharge: 2022-09-24 | DRG: 493 | Disposition: A | Discharge status: Home Health | Payer: Medicare HMO | Attending: Orthopedic Surgery | Admitting: Orthopedic Surgery

## 2022-09-21 ENCOUNTER — Inpatient Hospital Stay (HOSPITAL_COMMUNITY): Payer: Medicare HMO

## 2022-09-21 ENCOUNTER — Encounter (HOSPITAL_COMMUNITY)
Admission: RE | Disposition: A | Discharge status: Home Health | Payer: Self-pay | Source: Home / Self Care | Attending: Orthopedic Surgery

## 2022-09-21 ENCOUNTER — Encounter (HOSPITAL_COMMUNITY): Payer: Self-pay | Admitting: Orthopedic Surgery

## 2022-09-21 DIAGNOSIS — Z825 Family history of asthma and other chronic lower respiratory diseases: Secondary | ICD-10-CM | POA: Diagnosis not present

## 2022-09-21 DIAGNOSIS — E559 Vitamin D deficiency, unspecified: Secondary | ICD-10-CM | POA: Diagnosis present

## 2022-09-21 DIAGNOSIS — Z87891 Personal history of nicotine dependence: Secondary | ICD-10-CM | POA: Diagnosis not present

## 2022-09-21 DIAGNOSIS — Z90710 Acquired absence of both cervix and uterus: Secondary | ICD-10-CM | POA: Diagnosis not present

## 2022-09-21 DIAGNOSIS — Z833 Family history of diabetes mellitus: Secondary | ICD-10-CM | POA: Diagnosis not present

## 2022-09-21 DIAGNOSIS — Y92008 Other place in unspecified non-institutional (private) residence as the place of occurrence of the external cause: Secondary | ICD-10-CM | POA: Diagnosis not present

## 2022-09-21 DIAGNOSIS — R32 Unspecified urinary incontinence: Secondary | ICD-10-CM | POA: Diagnosis present

## 2022-09-21 DIAGNOSIS — W010XXA Fall on same level from slipping, tripping and stumbling without subsequent striking against object, initial encounter: Secondary | ICD-10-CM | POA: Diagnosis present

## 2022-09-21 DIAGNOSIS — Z806 Family history of leukemia: Secondary | ICD-10-CM | POA: Diagnosis not present

## 2022-09-21 DIAGNOSIS — S82832A Other fracture of upper and lower end of left fibula, initial encounter for closed fracture: Secondary | ICD-10-CM | POA: Diagnosis present

## 2022-09-21 DIAGNOSIS — Z8249 Family history of ischemic heart disease and other diseases of the circulatory system: Secondary | ICD-10-CM

## 2022-09-21 DIAGNOSIS — D62 Acute posthemorrhagic anemia: Secondary | ICD-10-CM | POA: Diagnosis not present

## 2022-09-21 DIAGNOSIS — M898X9 Other specified disorders of bone, unspecified site: Secondary | ICD-10-CM | POA: Diagnosis present

## 2022-09-21 DIAGNOSIS — Z9049 Acquired absence of other specified parts of digestive tract: Secondary | ICD-10-CM | POA: Diagnosis not present

## 2022-09-21 DIAGNOSIS — R7303 Prediabetes: Secondary | ICD-10-CM | POA: Diagnosis present

## 2022-09-21 DIAGNOSIS — E785 Hyperlipidemia, unspecified: Secondary | ICD-10-CM | POA: Diagnosis present

## 2022-09-21 DIAGNOSIS — R42 Dizziness and giddiness: Secondary | ICD-10-CM | POA: Diagnosis not present

## 2022-09-21 DIAGNOSIS — I2510 Atherosclerotic heart disease of native coronary artery without angina pectoris: Secondary | ICD-10-CM | POA: Diagnosis present

## 2022-09-21 DIAGNOSIS — K219 Gastro-esophageal reflux disease without esophagitis: Secondary | ICD-10-CM | POA: Diagnosis present

## 2022-09-21 DIAGNOSIS — Z885 Allergy status to narcotic agent status: Secondary | ICD-10-CM | POA: Diagnosis not present

## 2022-09-21 DIAGNOSIS — S82142A Displaced bicondylar fracture of left tibia, initial encounter for closed fracture: Secondary | ICD-10-CM

## 2022-09-21 DIAGNOSIS — Z823 Family history of stroke: Secondary | ICD-10-CM

## 2022-09-21 HISTORY — DX: Prediabetes: R73.03

## 2022-09-21 HISTORY — PX: ORIF TIBIA PLATEAU: SHX2132

## 2022-09-21 LAB — COMPREHENSIVE METABOLIC PANEL
ALT: 19 U/L (ref 0–44)
AST: 31 U/L (ref 15–41)
Albumin: 3.3 g/dL — ABNORMAL LOW (ref 3.5–5.0)
Alkaline Phosphatase: 74 U/L (ref 38–126)
Anion gap: 10 (ref 5–15)
BUN: 18 mg/dL (ref 8–23)
CO2: 25 mmol/L (ref 22–32)
Calcium: 8.6 mg/dL — ABNORMAL LOW (ref 8.9–10.3)
Chloride: 102 mmol/L (ref 98–111)
Creatinine, Ser: 0.69 mg/dL (ref 0.44–1.00)
GFR, Estimated: >60 mL/min (ref >60)
Glucose, Bld: 153 mg/dL — ABNORMAL HIGH (ref 70–99)
Potassium: 4.1 mmol/L (ref 3.5–5.1)
Sodium: 137 mmol/L (ref 135–145)
Total Bilirubin: 0.9 mg/dL (ref 0.3–1.2)
Total Protein: 6 g/dL — ABNORMAL LOW (ref 6.5–8.1)

## 2022-09-21 LAB — CBC
HCT: 31.2 % — ABNORMAL LOW (ref 36.0–46.0)
Hemoglobin: 10.3 g/dL — ABNORMAL LOW (ref 12.0–15.0)
MCH: 32.7 pg (ref 26.0–34.0)
MCHC: 33 g/dL (ref 30.0–36.0)
MCV: 99 fL (ref 80.0–100.0)
Platelets: 201 10*3/uL (ref 150–400)
RBC: 3.15 MIL/uL — ABNORMAL LOW (ref 3.87–5.11)
RDW: 12.6 % (ref 11.5–15.5)
WBC: 10.8 10*3/uL — ABNORMAL HIGH (ref 4.0–10.5)
nRBC: 0 % (ref 0.0–0.2)

## 2022-09-21 LAB — URINALYSIS, ROUTINE W REFLEX MICROSCOPIC
Bilirubin Urine: NEGATIVE
Glucose, UA: NEGATIVE mg/dL
Hgb urine dipstick: NEGATIVE
Ketones, ur: NEGATIVE mg/dL
Nitrite: POSITIVE — AB
Protein, ur: NEGATIVE mg/dL
Specific Gravity, Urine: 1.016 (ref 1.005–1.030)
pH: 6 (ref 5.0–8.0)

## 2022-09-21 LAB — GLUCOSE, CAPILLARY: Glucose-Capillary: 97 mg/dL (ref 70–99)

## 2022-09-21 SURGERY — OPEN REDUCTION INTERNAL FIXATION (ORIF) TIBIAL PLATEAU
Anesthesia: General | Laterality: Left

## 2022-09-21 MED ORDER — METHOCARBAMOL 1000 MG/10ML IJ SOLN
500.0000 mg | Freq: Three times a day (TID) | INTRAVENOUS | Status: DC
  Filled 2022-09-21: qty 5

## 2022-09-21 MED ORDER — POTASSIUM CHLORIDE IN NACL 20-0.9 MEQ/L-% IV SOLN
INTRAVENOUS | Status: DC
  Filled 2022-09-21 (×2): qty 1000

## 2022-09-21 MED ORDER — DOCUSATE SODIUM 100 MG PO CAPS
100.0000 mg | ORAL_CAPSULE | Freq: Two times a day (BID) | ORAL | Status: DC
  Administered 2022-09-21 – 2022-09-24 (×6): 100 mg via ORAL
  Filled 2022-09-21 (×6): qty 1

## 2022-09-21 MED ORDER — KETOROLAC TROMETHAMINE 15 MG/ML IJ SOLN
15.0000 mg | Freq: Three times a day (TID) | INTRAMUSCULAR | Status: DC
  Administered 2022-09-21 – 2022-09-24 (×8): 15 mg via INTRAVENOUS
  Filled 2022-09-21 (×8): qty 1

## 2022-09-21 MED ORDER — SUGAMMADEX SODIUM 200 MG/2ML IV SOLN
INTRAVENOUS | Status: DC | PRN
  Administered 2022-09-21: 200 mg via INTRAVENOUS

## 2022-09-21 MED ORDER — FENTANYL CITRATE (PF) 100 MCG/2ML IJ SOLN
25.0000 ug | INTRAMUSCULAR | Status: DC | PRN
  Administered 2022-09-21: 25 ug via INTRAVENOUS
  Administered 2022-09-21: 50 ug via INTRAVENOUS

## 2022-09-21 MED ORDER — MIDAZOLAM HCL 2 MG/2ML IJ SOLN
INTRAMUSCULAR | Status: DC | PRN
  Administered 2022-09-21: 2 mg via INTRAVENOUS

## 2022-09-21 MED ORDER — FENTANYL CITRATE PF 50 MCG/ML IJ SOSY: 12.5000 ug | PREFILLED_SYRINGE | INTRAMUSCULAR | Status: DC | PRN

## 2022-09-21 MED ORDER — FENTANYL CITRATE (PF) 250 MCG/5ML IJ SOLN
INTRAMUSCULAR | Status: DC | PRN
  Administered 2022-09-21: 125 ug via INTRAVENOUS

## 2022-09-21 MED ORDER — CHLORHEXIDINE GLUCONATE 0.12 % MT SOLN
15.0000 mL | Freq: Once | OROMUCOSAL | Status: AC
  Administered 2022-09-21: 15 mL via OROMUCOSAL

## 2022-09-21 MED ORDER — MAGNESIUM CITRATE PO SOLN: 1.0000 | Freq: Once | ORAL | Status: DC | PRN

## 2022-09-21 MED ORDER — CEFAZOLIN SODIUM-DEXTROSE 2-4 GM/100ML-% IV SOLN
INTRAVENOUS | Status: AC
  Filled 2022-09-21: qty 100

## 2022-09-21 MED ORDER — ONDANSETRON HCL 4 MG PO TABS: 4.0000 mg | ORAL_TABLET | Freq: Four times a day (QID) | ORAL | Status: DC | PRN

## 2022-09-21 MED ORDER — METOCLOPRAMIDE HCL 5 MG/ML IJ SOLN: 5.0000 mg | Freq: Three times a day (TID) | INTRAMUSCULAR | Status: DC | PRN

## 2022-09-21 MED ORDER — LIDOCAINE 2% (20 MG/ML) 5 ML SYRINGE
INTRAMUSCULAR | Status: DC | PRN
  Administered 2022-09-21: 60 mg via INTRAVENOUS

## 2022-09-21 MED ORDER — FENTANYL CITRATE (PF) 250 MCG/5ML IJ SOLN
INTRAMUSCULAR | Status: AC
  Filled 2022-09-21: qty 5

## 2022-09-21 MED ORDER — PHENYLEPHRINE HCL-NACL 20-0.9 MG/250ML-% IV SOLN
INTRAVENOUS | Status: DC | PRN
  Administered 2022-09-21: 40 ug/min via INTRAVENOUS

## 2022-09-21 MED ORDER — METHOCARBAMOL 750 MG PO TABS
750.0000 mg | ORAL_TABLET | Freq: Three times a day (TID) | ORAL | Status: DC
  Administered 2022-09-21 – 2022-09-24 (×8): 750 mg via ORAL
  Filled 2022-09-21 (×8): qty 1

## 2022-09-21 MED ORDER — ENOXAPARIN SODIUM 40 MG/0.4ML IJ SOSY
40.0000 mg | PREFILLED_SYRINGE | INTRAMUSCULAR | Status: DC
  Administered 2022-09-22: 40 mg via SUBCUTANEOUS
  Filled 2022-09-21: qty 0.4

## 2022-09-21 MED ORDER — ONDANSETRON HCL 4 MG/2ML IJ SOLN
INTRAMUSCULAR | Status: DC | PRN
  Administered 2022-09-21: 4 mg via INTRAVENOUS

## 2022-09-21 MED ORDER — 0.9 % SODIUM CHLORIDE (POUR BTL) OPTIME
TOPICAL | Status: DC | PRN
  Administered 2022-09-21: 1000 mL

## 2022-09-21 MED ORDER — FENTANYL CITRATE (PF) 100 MCG/2ML IJ SOLN
INTRAMUSCULAR | Status: AC
  Filled 2022-09-21: qty 2

## 2022-09-21 MED ORDER — OXYBUTYNIN CHLORIDE ER 5 MG PO TB24
5.0000 mg | ORAL_TABLET | Freq: Every day | ORAL | Status: DC
  Administered 2022-09-21 – 2022-09-23 (×3): 5 mg via ORAL
  Filled 2022-09-21 (×4): qty 1

## 2022-09-21 MED ORDER — PHENYLEPHRINE 80 MCG/ML (10ML) SYRINGE FOR IV PUSH (FOR BLOOD PRESSURE SUPPORT)
PREFILLED_SYRINGE | INTRAVENOUS | Status: DC | PRN
  Administered 2022-09-21: 80 ug via INTRAVENOUS

## 2022-09-21 MED ORDER — DEXAMETHASONE SODIUM PHOSPHATE 10 MG/ML IJ SOLN
INTRAMUSCULAR | Status: DC | PRN
  Administered 2022-09-21: 10 mg via INTRAVENOUS

## 2022-09-21 MED ORDER — ONDANSETRON HCL 4 MG/2ML IJ SOLN
INTRAMUSCULAR | Status: AC
  Filled 2022-09-21: qty 2

## 2022-09-21 MED ORDER — LACTATED RINGERS IV SOLN: INTRAVENOUS | Status: DC

## 2022-09-21 MED ORDER — BISACODYL 5 MG PO TBEC: 5.0000 mg | DELAYED_RELEASE_TABLET | Freq: Every day | ORAL | Status: DC | PRN

## 2022-09-21 MED ORDER — CEFAZOLIN SODIUM-DEXTROSE 2-4 GM/100ML-% IV SOLN
2.0000 g | Freq: Once | INTRAVENOUS | Status: AC
  Administered 2022-09-21: 2 g via INTRAVENOUS

## 2022-09-21 MED ORDER — METOCLOPRAMIDE HCL 5 MG PO TABS: 5.0000 mg | ORAL_TABLET | Freq: Three times a day (TID) | ORAL | Status: DC | PRN

## 2022-09-21 MED ORDER — ROCURONIUM BROMIDE 10 MG/ML (PF) SYRINGE
PREFILLED_SYRINGE | INTRAVENOUS | Status: DC | PRN
  Administered 2022-09-21: 60 mg via INTRAVENOUS

## 2022-09-21 MED ORDER — HYDROXYZINE HCL 25 MG PO TABS: 25.0000 mg | ORAL_TABLET | Freq: Three times a day (TID) | ORAL | Status: DC | PRN

## 2022-09-21 MED ORDER — ACETAMINOPHEN 500 MG PO TABS
500.0000 mg | ORAL_TABLET | Freq: Two times a day (BID) | ORAL | Status: DC
  Administered 2022-09-21 – 2022-09-23 (×5): 500 mg via ORAL
  Filled 2022-09-21 (×6): qty 1

## 2022-09-21 MED ORDER — PANTOPRAZOLE SODIUM 40 MG PO TBEC
40.0000 mg | DELAYED_RELEASE_TABLET | Freq: Every day | ORAL | Status: DC
  Administered 2022-09-21 – 2022-09-24 (×4): 40 mg via ORAL
  Filled 2022-09-21 (×4): qty 1

## 2022-09-21 MED ORDER — HYDROCODONE-ACETAMINOPHEN 5-325 MG PO TABS
1.0000 | ORAL_TABLET | Freq: Four times a day (QID) | ORAL | Status: DC | PRN
  Administered 2022-09-21 – 2022-09-24 (×9): 2 via ORAL
  Filled 2022-09-21 (×9): qty 2

## 2022-09-21 MED ORDER — CEFAZOLIN SODIUM-DEXTROSE 1-4 GM/50ML-% IV SOLN
1.0000 g | Freq: Four times a day (QID) | INTRAVENOUS | Status: AC
  Administered 2022-09-21 – 2022-09-22 (×3): 1 g via INTRAVENOUS
  Filled 2022-09-21 (×3): qty 50

## 2022-09-21 MED ORDER — ORAL CARE MOUTH RINSE: 15.0000 mL | Freq: Once | OROMUCOSAL | Status: AC

## 2022-09-21 MED ORDER — PROPOFOL 500 MG/50ML IV EMUL
INTRAVENOUS | Status: DC | PRN
  Administered 2022-09-21: 25 ug/kg/min via INTRAVENOUS

## 2022-09-21 MED ORDER — PROPOFOL 10 MG/ML IV BOLUS
INTRAVENOUS | Status: DC | PRN
  Administered 2022-09-21: 100 mg via INTRAVENOUS

## 2022-09-21 MED ORDER — ONDANSETRON HCL 4 MG/2ML IJ SOLN: 4.0000 mg | Freq: Four times a day (QID) | INTRAMUSCULAR | Status: DC | PRN

## 2022-09-21 MED ORDER — POLYETHYLENE GLYCOL 3350 17 G PO PACK
17.0000 g | PACK | Freq: Every day | ORAL | Status: DC
  Administered 2022-09-21 – 2022-09-24 (×4): 17 g via ORAL
  Filled 2022-09-21 (×4): qty 1

## 2022-09-21 SURGICAL SUPPLY — 68 items
BIT DRILL 3.3 LONG (BIT) IMPLANT
BIT DRILL QC 3.3X195 (BIT) IMPLANT
BLADE CLIPPER SURG (BLADE) IMPLANT
BLADE SURG 10 STRL SS (BLADE) ×1 IMPLANT
BLADE SURG 15 STRL LF DISP TIS (BLADE) ×1 IMPLANT
BLADE SURG 15 STRL SS (BLADE) ×1
BNDG COHESIVE 4X5 TAN STRL (GAUZE/BANDAGES/DRESSINGS) ×1 IMPLANT
BNDG ELASTIC 4X5.8 VLCR STR LF (GAUZE/BANDAGES/DRESSINGS) ×1 IMPLANT
BNDG ELASTIC 6X10 VLCR STRL LF (GAUZE/BANDAGES/DRESSINGS) IMPLANT
BNDG ELASTIC 6X5.8 VLCR STR LF (GAUZE/BANDAGES/DRESSINGS) ×1 IMPLANT
BNDG GAUZE DERMACEA FLUFF 4 (GAUZE/BANDAGES/DRESSINGS) ×1 IMPLANT
BRUSH SCRUB EZ PLAIN DRY (MISCELLANEOUS) ×2 IMPLANT
CANISTER SUCT 3000ML PPV (MISCELLANEOUS) ×1 IMPLANT
CAP LOCK NCB (Cap) IMPLANT
COVER SURGICAL LIGHT HANDLE (MISCELLANEOUS) ×1 IMPLANT
DRAPE C-ARM 42X72 X-RAY (DRAPES) ×1 IMPLANT
DRAPE HALF SHEET 40X57 (DRAPES) IMPLANT
DRAPE INCISE IOBAN 66X45 STRL (DRAPES) ×1 IMPLANT
DRAPE U-SHAPE 47X51 STRL (DRAPES) ×1 IMPLANT
DRSG ADAPTIC 3X8 NADH LF (GAUZE/BANDAGES/DRESSINGS) ×1 IMPLANT
DRSG MEPILEX BORD LITE 2X5 (GAUZE/BANDAGES/DRESSINGS) IMPLANT
ELECT REM PT RETURN 9FT ADLT (ELECTROSURGICAL) ×1
ELECTRODE REM PT RTRN 9FT ADLT (ELECTROSURGICAL) ×1 IMPLANT
GAUZE SPONGE 4X4 12PLY STRL (GAUZE/BANDAGES/DRESSINGS) ×1 IMPLANT
GLOVE BIO SURGEON STRL SZ7.5 (GLOVE) ×1 IMPLANT
GLOVE BIO SURGEON STRL SZ8 (GLOVE) ×1 IMPLANT
GLOVE BIOGEL PI IND STRL 7.5 (GLOVE) ×1 IMPLANT
GLOVE BIOGEL PI IND STRL 8 (GLOVE) ×1 IMPLANT
GLOVE SURG ORTHO LTX SZ7.5 (GLOVE) ×2 IMPLANT
GOWN STRL REUS W/ TWL LRG LVL3 (GOWN DISPOSABLE) ×2 IMPLANT
GOWN STRL REUS W/ TWL XL LVL3 (GOWN DISPOSABLE) ×1 IMPLANT
GOWN STRL REUS W/TWL LRG LVL3 (GOWN DISPOSABLE) ×2
GOWN STRL REUS W/TWL XL LVL3 (GOWN DISPOSABLE) ×1
IMMOBILIZER KNEE 22 UNIV (SOFTGOODS) ×1 IMPLANT
K-WIRE 2.0 (WIRE) ×2
K-WIRE FXSTD 280X2XNS SS (WIRE) ×2
KIT BASIN OR (CUSTOM PROCEDURE TRAY) ×1 IMPLANT
KIT TURNOVER KIT B (KITS) ×1 IMPLANT
KWIRE FXSTD 280X2XNS SS (WIRE) IMPLANT
NS IRRIG 1000ML POUR BTL (IV SOLUTION) ×1 IMPLANT
PACK ORTHO EXTREMITY (CUSTOM PROCEDURE TRAY) ×1 IMPLANT
PAD ARMBOARD 7.5X6 YLW CONV (MISCELLANEOUS) ×2 IMPLANT
PADDING CAST COTTON 6X4 STRL (CAST SUPPLIES) ×1 IMPLANT
PADDING CAST SYNTHETIC 4X4 STR (CAST SUPPLIES) IMPLANT
PADDING CAST SYNTHETIC 6X4 NS (CAST SUPPLIES) IMPLANT
PLATE LAT PROX 3H TIB 5H LEFT (Miscellaneous) IMPLANT
SCREW HUM NCB PA ST 4X60 (Screw) IMPLANT
SCREW NCB 4.0 28MM (Screw) IMPLANT
SCREW NCB 4.0 32MM (Screw) IMPLANT
SCREW NCB 4.0MX30M (Screw) IMPLANT
SCREW NCB 4X3 4X70 (Screw) IMPLANT
SCREW PROX ST NCB 4X80 (Screw) IMPLANT
SPONGE T-LAP 18X18 ~~LOC~~+RFID (SPONGE) ×1 IMPLANT
STOCKINETTE IMPERVIOUS LG (DRAPES) ×1 IMPLANT
SUCTION FRAZIER HANDLE 10FR (MISCELLANEOUS) ×1
SUCTION TUBE FRAZIER 10FR DISP (MISCELLANEOUS) ×1 IMPLANT
SUT ETHILON 2 0 FS 18 (SUTURE) IMPLANT
SUT VIC AB 0 CT1 27 (SUTURE) ×1
SUT VIC AB 0 CT1 27XBRD ANBCTR (SUTURE) ×1 IMPLANT
SUT VIC AB 1 CT1 27 (SUTURE) ×1
SUT VIC AB 1 CT1 27XBRD ANBCTR (SUTURE) ×1 IMPLANT
SUT VIC AB 2-0 CT1 27 (SUTURE) ×2
SUT VIC AB 2-0 CT1 TAPERPNT 27 (SUTURE) ×2 IMPLANT
TOWEL GREEN STERILE (TOWEL DISPOSABLE) ×2 IMPLANT
TOWEL GREEN STERILE FF (TOWEL DISPOSABLE) ×1 IMPLANT
TUBE CONNECTING 12X1/4 (SUCTIONS) ×1 IMPLANT
WATER STERILE IRR 1000ML POUR (IV SOLUTION) ×2 IMPLANT
YANKAUER SUCT BULB TIP NO VENT (SUCTIONS) ×1 IMPLANT

## 2022-09-21 NOTE — Anesthesia Preprocedure Evaluation (Signed)
Anesthesia Evaluation  Patient identified by MRN, date of birth, ID band Patient awake    Reviewed: Allergy & Precautions, NPO status , Patient's Chart, lab work & pertinent test results  Airway Mallampati: II  TM Distance: >3 FB     Dental no notable dental hx.    Pulmonary COPD, former smoker   Pulmonary exam normal        Cardiovascular + CAD   Rhythm:Regular Rate:Normal     Neuro/Psych negative neurological ROS  negative psych ROS   GI/Hepatic Neg liver ROS,GERD  Medicated,,  Endo/Other  negative endocrine ROS    Renal/GU negative Renal ROS  negative genitourinary   Musculoskeletal negative musculoskeletal ROS (+)    Abdominal Normal abdominal exam  (+)   Peds  Hematology negative hematology ROS (+)   Anesthesia Other Findings   Reproductive/Obstetrics                             Anesthesia Physical Anesthesia Plan  ASA: 2  Anesthesia Plan: General   Post-op Pain Management:    Induction: Intravenous  PONV Risk Score and Plan: 3 and Ondansetron, Dexamethasone and Treatment may vary due to age or medical condition  Airway Management Planned: Mask and Oral ETT  Additional Equipment: None  Intra-op Plan:   Post-operative Plan: Extubation in OR  Informed Consent: I have reviewed the patients History and Physical, chart, labs and discussed the procedure including the risks, benefits and alternatives for the proposed anesthesia with the patient or authorized representative who has indicated his/her understanding and acceptance.     Dental advisory given  Plan Discussed with: CRNA  Anesthesia Plan Comments:        Anesthesia Quick Evaluation

## 2022-09-21 NOTE — Plan of Care (Signed)
  Problem: Health Behavior/Discharge Planning: Goal: Ability to manage health-related needs will improve Outcome: Progressing   Problem: Clinical Measurements: Goal: Will remain free from infection Outcome: Progressing   Problem: Clinical Measurements: Goal: Diagnostic test results will improve Outcome: Progressing   Problem: Clinical Measurements: Goal: Respiratory complications will improve Outcome: Progressing   Problem: Clinical Measurements: Goal: Cardiovascular complication will be avoided Outcome: Progressing   Problem: Safety: Goal: Ability to remain free from injury will improve Outcome: Progressing   Problem: Skin Integrity: Goal: Risk for impaired skin integrity will decrease Outcome: Progressing

## 2022-09-21 NOTE — Anesthesia Procedure Notes (Signed)
Procedure Name: Intubation Date/Time: 09/21/2022 3:12 PM  Performed by: Heide Scales, CRNAPre-anesthesia Checklist: Patient identified, Emergency Drugs available, Suction available and Patient being monitored Patient Re-evaluated:Patient Re-evaluated prior to induction Oxygen Delivery Method: Circle system utilized Preoxygenation: Pre-oxygenation with 100% oxygen Induction Type: IV induction Ventilation: Mask ventilation without difficulty Laryngoscope Size: Mac and 3 Grade View: Grade I Tube type: Oral Tube size: 7.0 mm Number of attempts: 1 Airway Equipment and Method: Stylet and Oral airway Placement Confirmation: ETT inserted through vocal cords under direct vision, positive ETCO2 and breath sounds checked- equal and bilateral Secured at: 22 cm Tube secured with: Tape Dental Injury: Teeth and Oropharynx as per pre-operative assessment

## 2022-09-21 NOTE — Progress Notes (Signed)
PROGRESS NOTE    Erika Smith  RRN:[id number] DOB: 08/28/1956 DOA: 09/19/2022 PCP: Waylan Rocher, MD    Brief Narrative:   Erika Smith is a 67 y.o. female with past medical history significant for HLD, GERD, urinary continence who presented to [facility] ED on 12/31 with left lower extremity pain following mechanical fall.  Patient reports she was tripped by her dog's and landed on a flexed knee.  Imaging notable for left tib/fib fracture.  Admitted to orthopedic service.  Patient developed severe nausea and vomiting due to multiple pain medication including codeine, morphine, oxycodone and tramadol.  Hospitalist service was consulted for assistance with pain control.  Assessment & Plan:   Closed fracture left tibial plateau and fibular head Patient presenting fall mechanical fall at home.  Imaging notable for markedly comminuted and mildly displaced fracture involving proximal lateral tibia extending through the medial proximal tibial metaphyseal cortex, and proximal fibular with moderate joint effusion.  Patient has been intolerant to multiple narcotics including codeine, morphine, oxycodone, tramadol and Dilaudid.  In which this is a longstanding issue for her. -- Tylenol 1000 mg IV every 8 hours -- Norco 5-325 mg 1-2 tablets every 4 hours as needed moderate pain; currently tolerating -- Fentanyl 25-50 mcg IV every 2 hours as needed severe pain; -- Supportive care with antiemetics, Zofran/Compazine/Phenergan -- Scopolamine patch -- NWB LLE, knee immobilizer in place, elevation LLE, on Lovenox for DVT prophylaxis -- Pending transfer to [facility] per orthopedics for surgery today -- Further per orthopedics  HLD: Atorvastatin 20 mg p.o. daily  GERD: Protonix substituted for outpatient omeprazole   DVT prophylaxis: SCDs Start: 09/19/22 1959 Place TED hose Start: 09/19/22 1959    Code Status: Full Code Family Communication: No family present at bedside this  morning  Disposition Plan:  Level of care: Med-Surg Status is: Inpatient Remains inpatient appropriate because: Pending transfer to [facility] for definitive surgery today, further per primary orthopedics    Subjective: Patient seen examined at bedside, resting comfortably.  RN present.  Will started on hydrocodone last night and currently tolerating without any nausea/vomiting.  Awaiting transfer to [facility] for definitive surgery per orthopedics planned for today.  Patient appreciative all the care she has received at [facility].  No other specific questions or concerns at this time.  Denies headache, no dizziness, no chest pain, no palpitations, no shortness of breath, no abdominal pain, no current nausea/vomiting, no diarrhea, no fever/chills/night sweats, no paresthesias.  No acute events overnight per nursing staff.  Objective: Vitals:   09/20/22 0848 09/20/22 1559 09/20/22 2322 09/21/22 0855  BP: (!) 103/49 (!) 112/55 114/64 (!) 115/57  Pulse: 68 79 93 80  Resp: 18 18 18 18   Temp: 98.2 F (36.8 C) 98.6 F (37 C) 98.3 F (36.8 C) 98.2 F (36.8 C)  TempSrc:  Oral    SpO2: 95% 94% 93% 95%  Weight:      Height:        Intake/Output Summary (Last 24 hours) at 09/21/2022 0958 Last data filed at 09/21/2022 0858 Gross per 24 hour  Intake 826.62 ml  Output 1800 ml  Net -973.38 ml   Filed Weights   09/19/22 1518  Weight: 63 kg    Examination:  Physical Exam: GEN: NAD, alert and oriented x 3, wd/wn HEENT: NCAT, PERRL, EOMI, sclera clear, MMM PULM: CTAB w/o wheezes/crackles, normal respiratory effort, on room air CV: RRR w/o M/G/R GI: abd soft, NTND, NABS, no R/G/M MSK: no peripheral  edema, left lower extremity with knee immobilizer in place, neurovascularly intact, otherwise moves all extremities independently NEURO: CN II-XII intact, no focal deficits, sensation to light touch intact PSYCH: normal mood/affect Integumentary: dry/intact, no rashes or wounds    Data Reviewed:  I have personally reviewed following labs and imaging studies  CBC: Recent Labs  Lab 09/19/22 1522 09/20/22 0908  WBC 9.1 7.0  HGB 13.5 10.3*  HCT 41.2 31.2*  MCV 96.5 95.4  PLT 287 161   Basic Metabolic Panel: Recent Labs  Lab 09/19/22 1522 09/20/22 0908  NA 141 136  K 4.2 3.8  CL 106 104  CO2 27 23  GLUCOSE 149* 106*  BUN 32* 27*  CREATININE 0.71 0.72  CALCIUM 10.1 8.4*   GFR: Estimated Creatinine Clearance: 55.8 mL/min (by C-G formula based on SCr of 0.72 mg/dL). Liver Function Tests: Recent Labs  Lab 09/19/22 1522  AST 26  ALT 20  ALKPHOS 85  BILITOT 0.5  PROT 8.4*  ALBUMIN 4.8   No results for input(s): "LIPASE", "AMYLASE" in the last 168 hours. No results for input(s): "AMMONIA" in the last 168 hours. Coagulation Profile: Recent Labs  Lab 09/19/22 2037  INR 1.0   Cardiac Enzymes: No results for input(s): "CKTOTAL", "CKMB", "CKMBINDEX", "TROPONINI" in the last 168 hours. BNP (last 3 results) No results for input(s): "PROBNP" in the last 8760 hours. HbA1C: No results for input(s): "HGBA1C" in the last 72 hours. CBG: No results for input(s): "GLUCAP" in the last 168 hours. Lipid Profile: No results for input(s): "CHOL", "HDL", "LDLCALC", "TRIG", "CHOLHDL", "LDLDIRECT" in the last 72 hours. Thyroid Function Tests: No results for input(s): "TSH", "T4TOTAL", "FREET4", "T3FREE", "THYROIDAB" in the last 72 hours. Anemia Panel: No results for input(s): "VITAMINB12", "FOLATE", "FERRITIN", "TIBC", "IRON", "RETICCTPCT" in the last 72 hours. Sepsis Labs: No results for input(s): "PROCALCITON", "LATICACIDVEN" in the last 168 hours.  No results found for this or any previous visit (from the past 240 hour(s)).       Radiology Studies: CT 3D RECON AT [facility]  Result Date: 09/21/2022 CLINICAL DATA:  Left tibial plateau fracture. EXAM: 3-DIMENSIONAL CT IMAGE RENDERING ON ACQUISITION WORKSTATION TECHNIQUE: 3-dimensional CT images were rendered by  post-processing of the original CT data on an acquisition workstation. The 3-dimensional CT images were interpreted and findings were reported in the accompanying complete CT report for this study. COMPARISON:  None Available. FINDINGS: See separate CT left knee report dated September 19, 2022. IMPRESSION: 1. Three-dimensional reconstructions of the left tibial plateau and fibular head fractures. Electronically Signed   By: Titus Dubin M.D.   On: 09/21/2022 09:13   CT KNEE LEFT WO CONTRAST  Result Date: 09/19/2022 CLINICAL DATA:  Knee trauma.  Lateral tibial plateau fracture. EXAM: CT OF THE LEFT KNEE WITHOUT CONTRAST TECHNIQUE: Multidetector CT imaging of the left knee was performed according to the standard protocol. Multiplanar CT image reconstructions were also generated. RADIATION DOSE REDUCTION: This exam was performed according to the departmental dose-optimization program which includes automated exposure control, adjustment of the mA and/or kV according to patient size and/or use of iterative reconstruction technique. COMPARISON:  Left knee radiographs 09/19/2022 FINDINGS: Bones/Joint/Cartilage There is mildly decreased bone mineralization. There is a markedly comminuted acute fracture of the proximal lateral tibia with fracture line extending from the lateral tibial spine inferiorly and laterally to the lateral proximal metaphyseal and epiphyseal tibial cortex and inferiorly and medially to the far medial proximal tibial metaphyseal cortex. There is up to approximately 5 mm diastasis  of the proximal lateral tibial fracture line. 2 mm lateral cortical step-off at the far anterolateral aspect of the proximal tibia (coronal series 8, image 61) and up to 11 mm lateral cortical step-off at the mid AP dimension of the proximal tibia (coronal series 8, image 71). At the proximal medial tibial metaphysis, there is up to 3 mm medial cortical step-off of the proximal fracture component posteriorly (coronal  series 8, image 79) and up to 7 mm lateral cortical step-off of the distal fracture component anteriorly (coronal series 8, image 58). There is a subtle nondisplaced fracture line also seen within the medial tibial spine (coronal images 73 through 80). No tibial plateau depression is visualized. There is a markedly comminuted fracture of the proximal fibula extending from the proximal cortex through the proximal fibular metaphysis. Up to 9 mm posterior cortical step-off posteriorly with mild posterior and medial angulation of the proximal posterior fracture fragment (coronal images 98 through 110 and sagittal image 100). Mild-to-moderate medial compartment of the knee joint space narrowing with mild peripheral degenerative osteophytes. Mild superior patellar degenerative osteophytosis. Ligaments Suboptimally assessed by CT. Muscles and Tendons Normal size and density of the regional musculature. No gross tendon tear is seen. Soft tissues There is a moderate joint effusion with layering dense hematocrit. There is moderate soft tissue swelling of the anterior proximal calf at the level of the tibial fractures. IMPRESSION: 1. Markedly comminuted and mildly displaced fracture lines involving the predominantly proximal lateral tibia extending through the medial proximal tibial metaphyseal cortex. 2. Markedly comminuted and mildly displaced acute fracture of the proximal fibula. 3. Moderate joint effusion with layering dense hematocrit. Electronically Signed   By: Neita Garnet M.D.   On: 09/19/2022 18:08   DG Tibia/Fibula Left Port  Result Date: 09/19/2022 CLINICAL DATA:  Injury. Fall. Dog ran into leg. Deformity noted at proximal tibia/fibula. EXAM: LEFT KNEE - COMPLETE 4+ VIEW; PORTABLE LEFT ANKLE - 2 VIEW; PORTABLE LEFT TIBIA AND FIBULA - 2 VIEW COMPARISON:  None Available. FINDINGS: Left knee: There is a comminuted fracture of the proximal fibula extending through the metaphysis with mild displacement of the  comminuted components, and up to 6 mm lateral cortical step-off, 8 mm posterior cortical step-off, and mild anterior apex angulation of the fracture. There is a transverse to oblique, mildly comminuted fracture of the proximal tibia, extending from the proximal lateral tibial physis and metadiaphysis through the proximal medial tibial metaphysis. 3 mm medial cortical step-off and 6 mm posterior cortical step-off. Mild medial knee compartment joint space narrowing and peripheral osteophytosis. No knee joint effusion. -- Left tibia and fibula: Likely vascular phleboliths overlie the posterior calf. -- Left ankle: The ankle mortise is symmetric and intact. Minimal chronic enthesopathic change at the Achilles insertion on the calcaneus. Subtle lucency measuring only approximately 1 mm in length at the far medial aspect of the medial malleolus, suspicious for an acute nondisplaced fracture. IMPRESSION: 1. Comminuted, mildly displaced, and angulated fracture of the proximal fibula. 2. Mildly comminuted acute fracture of the proximal tibia, extending from the proximal lateral tibial physis and metadiaphysis through the proximal medial tibial metaphysis. 3. Probable tiny 1 mm linear nondisplaced acute fracture at the far medial aspect of the medial malleolus. Recommend clinical correlation for point tenderness. Electronically Signed   By: Neita Garnet M.D.   On: 09/19/2022 16:46   DG Ankle Left Port  Result Date: 09/19/2022 CLINICAL DATA:  Injury. Fall. Dog ran into leg. Deformity noted at proximal tibia/fibula. EXAM: LEFT  KNEE - COMPLETE 4+ VIEW; PORTABLE LEFT ANKLE - 2 VIEW; PORTABLE LEFT TIBIA AND FIBULA - 2 VIEW COMPARISON:  None Available. FINDINGS: Left knee: There is a comminuted fracture of the proximal fibula extending through the metaphysis with mild displacement of the comminuted components, and up to 6 mm lateral cortical step-off, 8 mm posterior cortical step-off, and mild anterior apex angulation of the  fracture. There is a transverse to oblique, mildly comminuted fracture of the proximal tibia, extending from the proximal lateral tibial physis and metadiaphysis through the proximal medial tibial metaphysis. 3 mm medial cortical step-off and 6 mm posterior cortical step-off. Mild medial knee compartment joint space narrowing and peripheral osteophytosis. No knee joint effusion. -- Left tibia and fibula: Likely vascular phleboliths overlie the posterior calf. -- Left ankle: The ankle mortise is symmetric and intact. Minimal chronic enthesopathic change at the Achilles insertion on the calcaneus. Subtle lucency measuring only approximately 1 mm in length at the far medial aspect of the medial malleolus, suspicious for an acute nondisplaced fracture. IMPRESSION: 1. Comminuted, mildly displaced, and angulated fracture of the proximal fibula. 2. Mildly comminuted acute fracture of the proximal tibia, extending from the proximal lateral tibial physis and metadiaphysis through the proximal medial tibial metaphysis. 3. Probable tiny 1 mm linear nondisplaced acute fracture at the far medial aspect of the medial malleolus. Recommend clinical correlation for point tenderness. Electronically Signed   By: Neita Garnet M.D.   On: 09/19/2022 16:46   DG Knee Complete 4 Views Left  Result Date: 09/19/2022 CLINICAL DATA:  Injury. Fall. Dog ran into leg. Deformity noted at proximal tibia/fibula. EXAM: LEFT KNEE - COMPLETE 4+ VIEW; PORTABLE LEFT ANKLE - 2 VIEW; PORTABLE LEFT TIBIA AND FIBULA - 2 VIEW COMPARISON:  None Available. FINDINGS: Left knee: There is a comminuted fracture of the proximal fibula extending through the metaphysis with mild displacement of the comminuted components, and up to 6 mm lateral cortical step-off, 8 mm posterior cortical step-off, and mild anterior apex angulation of the fracture. There is a transverse to oblique, mildly comminuted fracture of the proximal tibia, extending from the proximal lateral  tibial physis and metadiaphysis through the proximal medial tibial metaphysis. 3 mm medial cortical step-off and 6 mm posterior cortical step-off. Mild medial knee compartment joint space narrowing and peripheral osteophytosis. No knee joint effusion. -- Left tibia and fibula: Likely vascular phleboliths overlie the posterior calf. -- Left ankle: The ankle mortise is symmetric and intact. Minimal chronic enthesopathic change at the Achilles insertion on the calcaneus. Subtle lucency measuring only approximately 1 mm in length at the far medial aspect of the medial malleolus, suspicious for an acute nondisplaced fracture. IMPRESSION: 1. Comminuted, mildly displaced, and angulated fracture of the proximal fibula. 2. Mildly comminuted acute fracture of the proximal tibia, extending from the proximal lateral tibial physis and metadiaphysis through the proximal medial tibial metaphysis. 3. Probable tiny 1 mm linear nondisplaced acute fracture at the far medial aspect of the medial malleolus. Recommend clinical correlation for point tenderness. Electronically Signed   By: Neita Garnet M.D.   On: 09/19/2022 16:46        Scheduled Meds:  atorvastatin  20 mg Oral Daily   docusate sodium  100 mg Oral BID   ondansetron (ZOFRAN) IV  4 mg Intravenous Q8H   pantoprazole  40 mg Oral Daily   scopolamine  1 patch Transdermal Q72H   Continuous Infusions:  sodium chloride 75 mL/hr at 09/21/22 0515   methocarbamol (ROBAXIN) IV  promethazine (PHENERGAN) injection (IM or IVPB) Stopped (09/20/22 0303)     LOS: 2 days    Time spent: 39 minutes spent on chart review, discussion with nursing staff, consultants, updating family and interview/physical exam; more than 50% of that time was spent in counseling and/or coordination of care.    Alvira Philips [place], DO [facility] Available via Epic secure chat 7am-7pm After these hours, please refer to coverage provider listed on [email or web address] 09/21/2022, 9:58 AM

## 2022-09-21 NOTE — Transfer of Care (Signed)
Immediate Anesthesia Transfer of Care Note  Patient: AARINI SLEE  Procedure(s) Performed: OPEN REDUCTION INTERNAL FIXATION (ORIF) TIBIAL PLATEAU (Left)  Patient Location: PACU  Anesthesia Type:General  Level of Consciousness: drowsy and patient cooperative  Airway & Oxygen Therapy: Patient Spontanous Breathing and Patient connected to nasal cannula oxygen  Post-op Assessment: Report given to RN, Post -op Vital signs reviewed and stable, and Patient moving all extremities X 4  Post vital signs: Reviewed and stable  Last Vitals:  Vitals Value Taken Time  BP 128/57 09/21/22 1649  Temp 36.5 C 09/21/22 1649  Pulse 87 09/21/22 1651  Resp 16 09/21/22 1651  SpO2 98 % 09/21/22 1651  Vitals shown include unvalidated device data.  Last Pain:  Vitals:   09/21/22 1231  TempSrc:   PainSc: 4          Complications: No notable events documented.

## 2022-09-21 NOTE — Progress Notes (Signed)
Subjective:  Patient remains stable.  She has been transferred to [facility] today for definitive surgical management of her left tibial plateau fracture.  Patient has been accepted by Dr. Altamese Chisago, the orthopedic trauma specialist who can provide fracture care expertise with this patient not available here at [facility].  Patient is currently comfortable.  She has been n.p.o. after midnight.  She has been off of Lovenox since yesterday.  Objective:   VITALS:   Vitals:   09/20/22 0848 09/20/22 1559 09/20/22 2322 09/21/22 0855  BP: (!) 103/49 (!) 112/55 114/64 (!) 115/57  Pulse: 68 79 93 80  Resp: 18 18 18 18   Temp: 98.2 F (36.8 C) 98.6 F (37 C) 98.3 F (36.8 C) 98.2 F (36.8 C)  TempSrc:  Oral    SpO2: 95% 94% 93% 95%  Weight:      Height:        PHYSICAL EXAM: Left lower extremity: A knee immobilizer remains in place. Patient's compartments are soft and compressible. Her skin is intact.  There is focal swelling over the proximal tibia. Patient remains neurovascular intact distally.   LABS  Results for orders placed or performed during the hospital encounter of 09/19/22 (from the past 24 hour(s))  Urinalysis, Routine w reflex microscopic     Status: Abnormal   Collection Time: 09/21/22  4:00 AM  Result Value Ref Range   Color, Urine YELLOW (A) YELLOW   APPearance HAZY (A) CLEAR   Specific Gravity, Urine 1.016 1.005 - 1.030   pH 6.0 5.0 - 8.0   Glucose, UA NEGATIVE NEGATIVE mg/dL   Hgb urine dipstick NEGATIVE NEGATIVE   Bilirubin Urine NEGATIVE NEGATIVE   Ketones, ur NEGATIVE NEGATIVE mg/dL   Protein, ur NEGATIVE NEGATIVE mg/dL   Nitrite POSITIVE (A) NEGATIVE   Leukocytes,Ua SMALL (A) NEGATIVE   WBC, UA 0-5 0 - 5 WBC/hpf   Bacteria, UA RARE (A) NONE SEEN   Squamous Epithelial / LPF 0-5 0 - 5 /HPF    CT 3D RECON AT SCANNER  Result Date: 09/21/2022 CLINICAL DATA:  Left tibial plateau fracture. EXAM: 3-DIMENSIONAL CT IMAGE  RENDERING ON ACQUISITION WORKSTATION TECHNIQUE: 3-dimensional CT images were rendered by post-processing of the original CT data on an acquisition workstation. The 3-dimensional CT images were interpreted and findings were reported in the accompanying complete CT report for this study. COMPARISON:  None Available. FINDINGS: See separate CT left knee report dated September 19, 2022. IMPRESSION: 1. Three-dimensional reconstructions of the left tibial plateau and fibular head fractures. Electronically Signed   By: Titus Dubin M.D.   On: 09/21/2022 09:13   CT KNEE LEFT WO CONTRAST  Result Date: 09/19/2022 CLINICAL DATA:  Knee trauma.  Lateral tibial plateau fracture. EXAM: CT OF THE LEFT KNEE WITHOUT CONTRAST TECHNIQUE: Multidetector CT imaging of the left knee was performed according to the standard protocol. Multiplanar CT image reconstructions were also generated. RADIATION DOSE REDUCTION: This exam was performed according to the departmental dose-optimization program which includes automated exposure control, adjustment of the mA and/or kV according to patient size and/or use of iterative reconstruction technique. COMPARISON:  Left knee radiographs 09/19/2022 FINDINGS: Bones/Joint/Cartilage There is mildly decreased bone mineralization. There is a markedly comminuted acute fracture of the proximal lateral tibia with fracture line extending from the lateral tibial spine inferiorly and laterally to the lateral proximal metaphyseal and epiphyseal tibial cortex and inferiorly and medially to the far medial proximal tibial metaphyseal cortex. There is up to approximately 5  mm diastasis of the proximal lateral tibial fracture line. 2 mm lateral cortical step-off at the far anterolateral aspect of the proximal tibia (coronal series 8, image 61) and up to 11 mm lateral cortical step-off at the mid AP dimension of the proximal tibia (coronal series 8, image 71). At the proximal medial tibial metaphysis, there is up  to 3 mm medial cortical step-off of the proximal fracture component posteriorly (coronal series 8, image 79) and up to 7 mm lateral cortical step-off of the distal fracture component anteriorly (coronal series 8, image 58). There is a subtle nondisplaced fracture line also seen within the medial tibial spine (coronal images 73 through 80). No tibial plateau depression is visualized. There is a markedly comminuted fracture of the proximal fibula extending from the proximal cortex through the proximal fibular metaphysis. Up to 9 mm posterior cortical step-off posteriorly with mild posterior and medial angulation of the proximal posterior fracture fragment (coronal images 98 through 110 and sagittal image 100). Mild-to-moderate medial compartment of the knee joint space narrowing with mild peripheral degenerative osteophytes. Mild superior patellar degenerative osteophytosis. Ligaments Suboptimally assessed by CT. Muscles and Tendons Normal size and density of the regional musculature. No gross tendon tear is seen. Soft tissues There is a moderate joint effusion with layering dense hematocrit. There is moderate soft tissue swelling of the anterior proximal calf at the level of the tibial fractures. IMPRESSION: 1. Markedly comminuted and mildly displaced fracture lines involving the predominantly proximal lateral tibia extending through the medial proximal tibial metaphyseal cortex. 2. Markedly comminuted and mildly displaced acute fracture of the proximal fibula. 3. Moderate joint effusion with layering dense hematocrit. Electronically Signed   By: Neita Garnet M.D.   On: 09/19/2022 18:08   DG Tibia/Fibula Left Port  Result Date: 09/19/2022 CLINICAL DATA:  Injury. Fall. Dog ran into leg. Deformity noted at proximal tibia/fibula. EXAM: LEFT KNEE - COMPLETE 4+ VIEW; PORTABLE LEFT ANKLE - 2 VIEW; PORTABLE LEFT TIBIA AND FIBULA - 2 VIEW COMPARISON:  None Available. FINDINGS: Left knee: There is a comminuted fracture  of the proximal fibula extending through the metaphysis with mild displacement of the comminuted components, and up to 6 mm lateral cortical step-off, 8 mm posterior cortical step-off, and mild anterior apex angulation of the fracture. There is a transverse to oblique, mildly comminuted fracture of the proximal tibia, extending from the proximal lateral tibial physis and metadiaphysis through the proximal medial tibial metaphysis. 3 mm medial cortical step-off and 6 mm posterior cortical step-off. Mild medial knee compartment joint space narrowing and peripheral osteophytosis. No knee joint effusion. -- Left tibia and fibula: Likely vascular phleboliths overlie the posterior calf. -- Left ankle: The ankle mortise is symmetric and intact. Minimal chronic enthesopathic change at the Achilles insertion on the calcaneus. Subtle lucency measuring only approximately 1 mm in length at the far medial aspect of the medial malleolus, suspicious for an acute nondisplaced fracture. IMPRESSION: 1. Comminuted, mildly displaced, and angulated fracture of the proximal fibula. 2. Mildly comminuted acute fracture of the proximal tibia, extending from the proximal lateral tibial physis and metadiaphysis through the proximal medial tibial metaphysis. 3. Probable tiny 1 mm linear nondisplaced acute fracture at the far medial aspect of the medial malleolus. Recommend clinical correlation for point tenderness. Electronically Signed   By: Neita Garnet M.D.   On: 09/19/2022 16:46   DG Ankle Left Port  Result Date: 09/19/2022 CLINICAL DATA:  Injury. Fall. Dog ran into leg. Deformity noted at proximal tibia/fibula.  EXAM: LEFT KNEE - COMPLETE 4+ VIEW; PORTABLE LEFT ANKLE - 2 VIEW; PORTABLE LEFT TIBIA AND FIBULA - 2 VIEW COMPARISON:  None Available. FINDINGS: Left knee: There is a comminuted fracture of the proximal fibula extending through the metaphysis with mild displacement of the comminuted components, and up to 6 mm lateral cortical  step-off, 8 mm posterior cortical step-off, and mild anterior apex angulation of the fracture. There is a transverse to oblique, mildly comminuted fracture of the proximal tibia, extending from the proximal lateral tibial physis and metadiaphysis through the proximal medial tibial metaphysis. 3 mm medial cortical step-off and 6 mm posterior cortical step-off. Mild medial knee compartment joint space narrowing and peripheral osteophytosis. No knee joint effusion. -- Left tibia and fibula: Likely vascular phleboliths overlie the posterior calf. -- Left ankle: The ankle mortise is symmetric and intact. Minimal chronic enthesopathic change at the Achilles insertion on the calcaneus. Subtle lucency measuring only approximately 1 mm in length at the far medial aspect of the medial malleolus, suspicious for an acute nondisplaced fracture. IMPRESSION: 1. Comminuted, mildly displaced, and angulated fracture of the proximal fibula. 2. Mildly comminuted acute fracture of the proximal tibia, extending from the proximal lateral tibial physis and metadiaphysis through the proximal medial tibial metaphysis. 3. Probable tiny 1 mm linear nondisplaced acute fracture at the far medial aspect of the medial malleolus. Recommend clinical correlation for point tenderness. Electronically Signed   By: Yvonne Kendall M.D.   On: 09/19/2022 16:46   DG Knee Complete 4 Views Left  Result Date: 09/19/2022 CLINICAL DATA:  Injury. Fall. Dog ran into leg. Deformity noted at proximal tibia/fibula. EXAM: LEFT KNEE - COMPLETE 4+ VIEW; PORTABLE LEFT ANKLE - 2 VIEW; PORTABLE LEFT TIBIA AND FIBULA - 2 VIEW COMPARISON:  None Available. FINDINGS: Left knee: There is a comminuted fracture of the proximal fibula extending through the metaphysis with mild displacement of the comminuted components, and up to 6 mm lateral cortical step-off, 8 mm posterior cortical step-off, and mild anterior apex angulation of the fracture. There is a transverse to oblique,  mildly comminuted fracture of the proximal tibia, extending from the proximal lateral tibial physis and metadiaphysis through the proximal medial tibial metaphysis. 3 mm medial cortical step-off and 6 mm posterior cortical step-off. Mild medial knee compartment joint space narrowing and peripheral osteophytosis. No knee joint effusion. -- Left tibia and fibula: Likely vascular phleboliths overlie the posterior calf. -- Left ankle: The ankle mortise is symmetric and intact. Minimal chronic enthesopathic change at the Achilles insertion on the calcaneus. Subtle lucency measuring only approximately 1 mm in length at the far medial aspect of the medial malleolus, suspicious for an acute nondisplaced fracture. IMPRESSION: 1. Comminuted, mildly displaced, and angulated fracture of the proximal fibula. 2. Mildly comminuted acute fracture of the proximal tibia, extending from the proximal lateral tibial physis and metadiaphysis through the proximal medial tibial metaphysis. 3. Probable tiny 1 mm linear nondisplaced acute fracture at the far medial aspect of the medial malleolus. Recommend clinical correlation for point tenderness. Electronically Signed   By: Yvonne Kendall M.D.   On: 09/19/2022 16:46    Assessment/Plan:     Principal Problem:   Closed fracture of left tibial plateau  Patient be transferred to [facility] today for definitive surgical management.    Thornton Park , MD 09/21/2022, 11:03 AM

## 2022-09-21 NOTE — Progress Notes (Signed)
Reason for Consultation: Left tibial plateau fracture Requesting Provider: Thornton Park, MD  Patient presented to ED after ground level fall precipitated by her dogs. Work up has revealed unstable left tibial plateau and comminuted fibular head fractures. She was admitted overnight to monitor for compartment swelling and to make sure she could mobilize safely.   Given the complexity of the fracture pattern, Dr. Mack Guise asserted this was outside his scope of practice and that it would be in the best interest of the patient to have these injuries evaluated and treated by a fellowship trained orthopaedic traumatologist. Consequently, I was consulted to provide further evaluation and management.  We are happy to receive her to [facility] in transfer today straight to preop holding. Transfer efforts through [facility] are under way.    Altamese Linwood, MD [facility], [facility] [phone]

## 2022-09-21 NOTE — Op Note (Signed)
09/21/2022 3:17 PM  PATIENT:  Erika Smith  67 y.o. female [id number]  PRE-OPERATIVE DIAGNOSIS:   1. LEFT BICONDYLAR TIBIAL PLATEAU FRACTURE 2. LEFT FIBULAR HEAD FRACTURE  POST-OPERATIVE DIAGNOSIS:   1. LEFT BICONDYLAR TIBIAL PLATEAU FRACTURE 2. LEFT FIBULAR HEAD FRACTURE  PROCEDURE:  Procedure(s): 1. OPEN REDUCTION INTERNAL FIXATION (ORIF) BICONDYLAR TIBIAL PLATEAU (Left) 2. ANTERIOR COMPARTMENT FASCIOTOMY 3. APPLICATION OF STRESS UNDER FLUOROSCOPY  SURGEON:  Surgeon(s) and Role:    Altamese [place], MD - Primary  PHYSICIAN ASSISTANT: Ainsley Spinner, PA-C  ANESTHESIA:   general  EBL:  Minimal  BLOOD ADMINISTERED:none  DRAINS: none   LOCAL MEDICATIONS USED:  NONE  SPECIMEN: None  DISPOSITION OF SPECIMEN:  N/A  COUNTS:  YES  TOURNIQUET:  * Missing tourniquet times found for documented tourniquets in log: [id number] *  DICTATION: Written below.  PLAN OF CARE: Admit to inpatient   PATIENT DISPOSITION:  PACU - hemodynamically stable.   Delay start of Pharmacological VTE agent (>24hrs) due to surgical blood loss or risk of bleeding: no     BRIEF SUMMARY AND INDICATION FOR PROCEDURE:  Patient is a 67 y.o.-year- old with a tibial plateau fracture after being run into by her labrador retrievers, treated provisionally with immediate immobilization, elevation, and active motion of the foot and toes to facilitate resolution of soft tissue swelling.  We did discuss with the patient and his brother the risks and benefits of surgical treatment including the potential for arthritis, nerve injury, vessel injury, loss of motion, DVT, PE, heart attack, stroke, symptomatic hardware, need for further surgery, and multiple others.  The patient acknowledged these risks and wished to proceed.   BRIEF SUMMARY OF PROCEDURE:  After administration of preoperative antibiotics, the patient was taken to the operating room.  General anesthesia was induced and the lower extremity prepped and  draped in usual sterile fashion using a chlorhexidine wash and betadine scrub and paint. A timeout was performed. I then brought in the radiolucent triangle. We began on the lateral side. A curvilinear incision was made extending laterally over Gerdy's tubercle. Dissection was carried down where the soft tissues were left intact to the lateral plateau and rim, elevated only the insertion of the extensors. By pulling traction and controlling angulation of the leg we were able to dial in provisional alignment. I was then able to insert the 5 hole Zimmer NCB tibial plateau plate and pin it proximally at the joint line. My assistant pulled traction and derotated the fracture while I used a large tenaculum to compress the reduction. I placed two screws at the joint line and one distally securing the buttress effect and confirmed with orthogonal views on fluoro. At this point, we placed additional standard screw fixation in the proximal row of the plate, which we would later convert to locked fixation by placing locking caps, and another two screws in the shaft. Final images showed appropriate fracture reduction, joint alignment, implant position and length. I then applied stress under fluoroscopy but did not see any opening to varus valgus stress. All wounds were irrigated thoroughly.   Prior to closure, I turned my attention to the distal edge of the wound here underneath the skin.  I used the long scissors to spread both superficial and deep to the anterior compartment.  The fascia was then released for 8 to 10 cm to reduce the likelihood of the postoperative compartment syndrome.  Once more, wound was irrigated and then a standard layered closure performed, 0 Vicryl, 2-0  Vicryl, and 3-0 nylon for the skin.  Sterile gently compressive dressing was applied and in knee immobilizer.  The patient was taken to the PACU in stable condition.   Ainsley Spinner, PA-C was present and assisting throughout with all  portions of the procedure.   PROGNOSIS: The patient will be provided a hinged knee brace to help protect her repair but importantly does not require formal bracing given the stable examination post fixation. Unrestricted range of motion will begin immediately. She will be nonweightbearing on the operative extremity, be on pharmacologic DVT prophylaxis, and mobilized with PT and OT. After discharge, we will plan to see back in about 2 weeks for removal of sutures. Discharge anticipated in two days. This has been explained to her two elder daughters.       Erika Smith, M.D.

## 2022-09-21 NOTE — Consult Note (Signed)
Reason for Consult:Left tibia plateau fx Referring Physician: [place] Park Time called: 1215 Time at bedside: Erika Smith is an 67 y.o. female.  HPI: Erika Smith was putting her Christmas decorations away on Sunday when her dogs hit her and knocked her to the ground. She had immediate left knee pain and could not get up. She was brought to [facility] and x-rays showed a tibia plateau fx. Orthopedic surgery was consulted and recommended surgery but due to the complexity of the fracture requested transfer to [place] for definitive care by an orthopedic trauma specialist. 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  Past Medical History:  Diagnosis Date   GERD (gastroesophageal reflux disease)    Hyperlipidemia    Urinary incontinence     Past Surgical History:  Procedure Laterality Date   ABDOMINAL HYSTERECTOMY  1982   BSO   APPENDECTOMY  1982   CARDIAC CATHETERIZATION     03 /2009   CHOLECYSTECTOMY  2010   MR Brain, Brain Stem  05/09/2007    Normal MRI.  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 Candace Cruise. LA Grade A reflux esophagitis.    Family History  Problem Relation Age of Onset   Stroke Mother    Diabetes Mother    Diabetes Mellitus II Father    Stroke Father    Heart disease Father    Congestive Heart Failure Father    Allergies Sister    Asthma Sister    Diabetes Brother    Heart disease Brother    Leukemia Other    Heart disease Sister        Had triple bypass   Cancer - Other Sister        Cancer    Social History:  reports that she quit smoking about 16 years ago. Her smoking use included cigarettes. She started smoking about 48 years ago. She has a 33.00 pack-year smoking history. She has never used smokeless tobacco. She reports that she does not drink alcohol and does not use drugs.  Allergies:  Allergies   Allergen Reactions   Codeine    Morphine    Percocet  [Oxycodone-Acetaminophen]    Tramadol Hcl     Medications: I have reviewed the patient's current medications.  Results for orders placed or performed during the hospital encounter of 09/21/22 (from the past 48 hour(s))  Glucose, capillary     Status: None   Collection Time: 09/21/22 12:22 PM  Result Value Ref Range   Glucose-Capillary 97 70 - 99 mg/dL    Comment: Glucose reference range applies only to samples taken after fasting for at least 8 hours.    CT 3D RECON AT SCANNER  Result Date: 09/21/2022 CLINICAL DATA:  Left tibial plateau fracture. EXAM: 3-DIMENSIONAL CT IMAGE RENDERING ON ACQUISITION WORKSTATION TECHNIQUE: 3-dimensional CT images were rendered by post-processing of the original CT data on an acquisition workstation. The 3-dimensional CT images were interpreted and findings were reported in the accompanying complete CT report for this study. COMPARISON:  None Available. FINDINGS: See separate CT left knee report dated September 19, 2022. IMPRESSION: 1. Three-dimensional reconstructions of the left tibial plateau and fibular head fractures. Electronically Signed   By: Titus Dubin M.D.   On: 09/21/2022 09:13   CT KNEE LEFT WO CONTRAST  Result Date: 09/19/2022  CLINICAL DATA:  Knee trauma.  Lateral tibial plateau fracture. EXAM: CT OF THE LEFT KNEE WITHOUT CONTRAST TECHNIQUE: Multidetector CT imaging of the left knee was performed according to the standard protocol. Multiplanar CT image reconstructions were also generated. RADIATION DOSE REDUCTION: This exam was performed according to the departmental dose-optimization program which includes automated exposure control, adjustment of the mA and/or kV according to patient size and/or use of iterative reconstruction technique. COMPARISON:  Left knee radiographs 09/19/2022 FINDINGS: Bones/Joint/Cartilage There is mildly decreased bone mineralization. There is a markedly  comminuted acute fracture of the proximal lateral tibia with fracture line extending from the lateral tibial spine inferiorly and laterally to the lateral proximal metaphyseal and epiphyseal tibial cortex and inferiorly and medially to the far medial proximal tibial metaphyseal cortex. There is up to approximately 5 mm diastasis of the proximal lateral tibial fracture line. 2 mm lateral cortical step-off at the far anterolateral aspect of the proximal tibia (coronal series 8, image 61) and up to 11 mm lateral cortical step-off at the mid AP dimension of the proximal tibia (coronal series 8, image 71). At the proximal medial tibial metaphysis, there is up to 3 mm medial cortical step-off of the proximal fracture component posteriorly (coronal series 8, image 79) and up to 7 mm lateral cortical step-off of the distal fracture component anteriorly (coronal series 8, image 58). There is a subtle nondisplaced fracture line also seen within the medial tibial spine (coronal images 73 through 80). No tibial plateau depression is visualized. There is a markedly comminuted fracture of the proximal fibula extending from the proximal cortex through the proximal fibular metaphysis. Up to 9 mm posterior cortical step-off posteriorly with mild posterior and medial angulation of the proximal posterior fracture fragment (coronal images 98 through 110 and sagittal image 100). Mild-to-moderate medial compartment of the knee joint space narrowing with mild peripheral degenerative osteophytes. Mild superior patellar degenerative osteophytosis. Ligaments Suboptimally assessed by CT. Muscles and Tendons Normal size and density of the regional musculature. No gross tendon tear is seen. Soft tissues There is a moderate joint effusion with layering dense hematocrit. There is moderate soft tissue swelling of the anterior proximal calf at the level of the tibial fractures. IMPRESSION: 1. Markedly comminuted and mildly displaced fracture lines  involving the predominantly proximal lateral tibia extending through the medial proximal tibial metaphyseal cortex. 2. Markedly comminuted and mildly displaced acute fracture of the proximal fibula. 3. Moderate joint effusion with layering dense hematocrit. Electronically Signed   By: Yvonne Kendall M.D.   On: 09/19/2022 18:08   DG Tibia/Fibula Left Port  Result Date: 09/19/2022 CLINICAL DATA:  Injury. Fall. Dog ran into leg. Deformity noted at proximal tibia/fibula. EXAM: LEFT KNEE - COMPLETE 4+ VIEW; PORTABLE LEFT ANKLE - 2 VIEW; PORTABLE LEFT TIBIA AND FIBULA - 2 VIEW COMPARISON:  None Available. FINDINGS: Left knee: There is a comminuted fracture of the proximal fibula extending through the metaphysis with mild displacement of the comminuted components, and up to 6 mm lateral cortical step-off, 8 mm posterior cortical step-off, and mild anterior apex angulation of the fracture. There is a transverse to oblique, mildly comminuted fracture of the proximal tibia, extending from the proximal lateral tibial physis and metadiaphysis through the proximal medial tibial metaphysis. 3 mm medial cortical step-off and 6 mm posterior cortical step-off. Mild medial knee compartment joint space narrowing and peripheral osteophytosis. No knee joint effusion. -- Left tibia and fibula: Likely vascular phleboliths overlie the posterior calf. -- Left ankle: The  ankle mortise is symmetric and intact. Minimal chronic enthesopathic change at the Achilles insertion on the calcaneus. Subtle lucency measuring only approximately 1 mm in length at the far medial aspect of the medial malleolus, suspicious for an acute nondisplaced fracture. IMPRESSION: 1. Comminuted, mildly displaced, and angulated fracture of the proximal fibula. 2. Mildly comminuted acute fracture of the proximal tibia, extending from the proximal lateral tibial physis and metadiaphysis through the proximal medial tibial metaphysis. 3. Probable tiny 1 mm linear  nondisplaced acute fracture at the far medial aspect of the medial malleolus. Recommend clinical correlation for point tenderness. Electronically Signed   By: Yvonne Kendall M.D.   On: 09/19/2022 16:46   DG Ankle Left Port  Result Date: 09/19/2022 CLINICAL DATA:  Injury. Fall. Dog ran into leg. Deformity noted at proximal tibia/fibula. EXAM: LEFT KNEE - COMPLETE 4+ VIEW; PORTABLE LEFT ANKLE - 2 VIEW; PORTABLE LEFT TIBIA AND FIBULA - 2 VIEW COMPARISON:  None Available. FINDINGS: Left knee: There is a comminuted fracture of the proximal fibula extending through the metaphysis with mild displacement of the comminuted components, and up to 6 mm lateral cortical step-off, 8 mm posterior cortical step-off, and mild anterior apex angulation of the fracture. There is a transverse to oblique, mildly comminuted fracture of the proximal tibia, extending from the proximal lateral tibial physis and metadiaphysis through the proximal medial tibial metaphysis. 3 mm medial cortical step-off and 6 mm posterior cortical step-off. Mild medial knee compartment joint space narrowing and peripheral osteophytosis. No knee joint effusion. -- Left tibia and fibula: Likely vascular phleboliths overlie the posterior calf. -- Left ankle: The ankle mortise is symmetric and intact. Minimal chronic enthesopathic change at the Achilles insertion on the calcaneus. Subtle lucency measuring only approximately 1 mm in length at the far medial aspect of the medial malleolus, suspicious for an acute nondisplaced fracture. IMPRESSION: 1. Comminuted, mildly displaced, and angulated fracture of the proximal fibula. 2. Mildly comminuted acute fracture of the proximal tibia, extending from the proximal lateral tibial physis and metadiaphysis through the proximal medial tibial metaphysis. 3. Probable tiny 1 mm linear nondisplaced acute fracture at the far medial aspect of the medial malleolus. Recommend clinical correlation for point tenderness.  Electronically Signed   By: Yvonne Kendall M.D.   On: 09/19/2022 16:46   DG Knee Complete 4 Views Left  Result Date: 09/19/2022 CLINICAL DATA:  Injury. Fall. Dog ran into leg. Deformity noted at proximal tibia/fibula. EXAM: LEFT KNEE - COMPLETE 4+ VIEW; PORTABLE LEFT ANKLE - 2 VIEW; PORTABLE LEFT TIBIA AND FIBULA - 2 VIEW COMPARISON:  None Available. FINDINGS: Left knee: There is a comminuted fracture of the proximal fibula extending through the metaphysis with mild displacement of the comminuted components, and up to 6 mm lateral cortical step-off, 8 mm posterior cortical step-off, and mild anterior apex angulation of the fracture. There is a transverse to oblique, mildly comminuted fracture of the proximal tibia, extending from the proximal lateral tibial physis and metadiaphysis through the proximal medial tibial metaphysis. 3 mm medial cortical step-off and 6 mm posterior cortical step-off. Mild medial knee compartment joint space narrowing and peripheral osteophytosis. No knee joint effusion. -- Left tibia and fibula: Likely vascular phleboliths overlie the posterior calf. -- Left ankle: The ankle mortise is symmetric and intact. Minimal chronic enthesopathic change at the Achilles insertion on the calcaneus. Subtle lucency measuring only approximately 1 mm in length at the far medial aspect of the medial malleolus, suspicious for an acute nondisplaced fracture. IMPRESSION:  1. Comminuted, mildly displaced, and angulated fracture of the proximal fibula. 2. Mildly comminuted acute fracture of the proximal tibia, extending from the proximal lateral tibial physis and metadiaphysis through the proximal medial tibial metaphysis. 3. Probable tiny 1 mm linear nondisplaced acute fracture at the far medial aspect of the medial malleolus. Recommend clinical correlation for point tenderness. Electronically Signed   By: Yvonne Kendall M.D.   On: 09/19/2022 16:46    Review of Systems  HENT:  Negative for ear discharge,  ear pain, hearing loss and tinnitus.   Eyes:  Negative for photophobia and pain.  Respiratory:  Negative for cough and shortness of breath.   Cardiovascular:  Negative for chest pain.  Gastrointestinal:  Negative for abdominal pain, nausea and vomiting.  Genitourinary:  Negative for dysuria, flank pain, frequency and urgency.  Musculoskeletal:  Positive for arthralgias (Left knee). Negative for back pain, myalgias and neck pain.  Neurological:  Negative for dizziness and headaches.  Hematological:  Does not bruise/bleed easily.  Psychiatric/Behavioral:  The patient is not nervous/anxious.    Blood pressure (!) 127/57, pulse 86, temperature 98.9 F (37.2 C), temperature source Oral, resp. rate 16. Physical Exam Constitutional:      General: She is not in acute distress.    Appearance: She is well-developed. She is not diaphoretic.  HENT:     Head: Normocephalic and atraumatic.  Eyes:     General: No scleral icterus.       Right eye: No discharge.        Left eye: No discharge.     Conjunctiva/sclera: Conjunctivae normal.  Cardiovascular:     Rate and Rhythm: Normal rate and regular rhythm.  Pulmonary:     Effort: Pulmonary effort is normal. No respiratory distress.  Musculoskeletal:     Cervical back: Normal range of motion.     Comments: LLE No traumatic wounds, ecchymosis, or rash  Severe TTP knee, KI in place  No ankle effusion  Sens DPN, SPN, TN intact  Motor EHL, ext, flex, evers 5/5  DP 2+, PT 2+, No significant edema  Skin:    General: Skin is warm and dry.  Neurological:     Mental Status: She is alert.  Psychiatric:        Mood and Affect: Mood normal.        Behavior: Behavior normal.     Assessment/Plan: Left tibia plateau fx -- Plan ORIF today by Dr. Marcelino Scot. Please keep NPO. Multiple medical problems including HLD, GERD, urinary incontinence -- per primary service    Lisette Abu, PA-C Orthopedic Surgery [phone] 09/21/2022, 12:24 PM

## 2022-09-21 NOTE — Progress Notes (Signed)
Orthopedic Tech Progress Note Patient Details:  Erika Smith 19-Jan-1956 [id number]  Called in order to [facility] for a ROM KNEE BRACE   Patient ID: Erika Smith, female   DOB: 1956-03-12, 67 y.o.   MRN: [id number]  Janit Pagan 09/21/2022, 5:26 PM

## 2022-09-22 ENCOUNTER — Encounter (HOSPITAL_COMMUNITY): Payer: Self-pay | Admitting: Orthopedic Surgery

## 2022-09-22 ENCOUNTER — Other Ambulatory Visit (HOSPITAL_COMMUNITY): Payer: Self-pay

## 2022-09-22 LAB — CBC
HCT: 29.9 % — ABNORMAL LOW (ref 36.0–46.0)
Hemoglobin: 9.8 g/dL — ABNORMAL LOW (ref 12.0–15.0)
MCH: 31.9 pg (ref 26.0–34.0)
MCHC: 32.8 g/dL (ref 30.0–36.0)
MCV: 97.4 fL (ref 80.0–100.0)
Platelets: 189 10*3/uL (ref 150–400)
RBC: 3.07 MIL/uL — ABNORMAL LOW (ref 3.87–5.11)
RDW: 12.4 % (ref 11.5–15.5)
WBC: 7.3 10*3/uL (ref 4.0–10.5)
nRBC: 0 % (ref 0.0–0.2)

## 2022-09-22 LAB — VITAMIN D 25 HYDROXY (VIT D DEFICIENCY, FRACTURES): Vit D, 25-Hydroxy: 28.04 ng/mL — ABNORMAL LOW (ref 30–100)

## 2022-09-22 MED ORDER — ADULT MULTIVITAMIN W/MINERALS CH
1.0000 | ORAL_TABLET | Freq: Every day | ORAL | Status: DC
  Administered 2022-09-22 – 2022-09-24 (×3): 1 via ORAL
  Filled 2022-09-22 (×3): qty 1

## 2022-09-22 MED ORDER — VITAMIN D 25 MCG (1000 UNIT) PO TABS
2000.0000 [IU] | ORAL_TABLET | Freq: Two times a day (BID) | ORAL | Status: DC
  Administered 2022-09-22 – 2022-09-24 (×5): 2000 [IU] via ORAL
  Filled 2022-09-22 (×5): qty 2

## 2022-09-22 MED ORDER — VITAMIN C 500 MG PO TABS
1000.0000 mg | ORAL_TABLET | Freq: Every day | ORAL | Status: DC
  Administered 2022-09-22 – 2022-09-24 (×3): 1000 mg via ORAL
  Filled 2022-09-22 (×3): qty 2

## 2022-09-22 MED ORDER — CALCIUM CITRATE 950 (200 CA) MG PO TABS
200.0000 mg | ORAL_TABLET | Freq: Two times a day (BID) | ORAL | Status: DC
  Administered 2022-09-22 – 2022-09-24 (×5): 200 mg via ORAL
  Filled 2022-09-22 (×5): qty 1

## 2022-09-22 MED ORDER — ZINC SULFATE 220 (50 ZN) MG PO CAPS
220.0000 mg | ORAL_CAPSULE | Freq: Every day | ORAL | Status: DC
  Administered 2022-09-22 – 2022-09-24 (×3): 220 mg via ORAL
  Filled 2022-09-22 (×3): qty 1

## 2022-09-22 MED ORDER — APIXABAN 2.5 MG PO TABS
2.5000 mg | ORAL_TABLET | Freq: Two times a day (BID) | ORAL | Status: DC
  Administered 2022-09-23 – 2022-09-24 (×3): 2.5 mg via ORAL
  Filled 2022-09-22 (×4): qty 1

## 2022-09-22 NOTE — Evaluation (Addendum)
Physical Therapy Evaluation Patient Details Name: Erika Smith MRN: [id number] DOB: 1956/05/03 Today's Date: 09/22/2022  History of Present Illness  Patient is a 67 year old female presents after fall with left tibial plateau fracture s/p ORIF with anterior compartment fasciotomy.  Clinical Impression  Patient is agreeable to PT evaluation. She is independent at baseline and lives in a single story home with  her spouse and daughter.  Patient reports her pain has improved significantly from yesterday. She reports 6/10 pain in the L knee that did not worsen with activity. Gait training initiated and patient able to maintain NWB of LLE with rolling walker with initial instruction on techniques. Activity tolerance in standing is limited by fatigue. Patient is hopeful to return home tomorrow with the support from her family. Recommend PT follow up to maximize independence and decrease caregiver burden in preparation for discharge home.      Recommendations for follow up therapy are one component of a multi-disciplinary discharge planning process, led by the attending physician.  Recommendations may be updated based on patient status, additional functional criteria and insurance authorization.  Follow Up Recommendations Home health PT      Assistance Recommended at Discharge Intermittent Supervision/Assistance  Patient can return home with the following  A little help with walking and/or transfers;Assist for transportation;Help with stairs or ramp for entrance;Assistance with cooking/housework    Equipment Recommendations Rolling walker (2 wheels);BSC/3in1  Recommendations for Other Services       Functional Status Assessment Patient has had a recent decline in their functional status and demonstrates the ability to make significant improvements in function in a reasonable and predictable amount of time.     Precautions / Restrictions Precautions Precautions: Fall Restrictions Weight  Bearing Restrictions: Yes LLE Weight Bearing: Non weight bearing Other Position/Activity Restrictions: ROM knee brace LLE. Ok to work on [employer] without brace with therapist supervision.    Mobility  Bed Mobility Overal bed mobility: Needs Assistance Bed Mobility: Supine to Sit     Supine to sit: Min guard     General bed mobility comments: verbal cues for technique. increased time required. no increased pain reported with bed mobility and increased knee flexion with functional activity    Transfers Overall transfer level: Needs assistance Equipment used: Rolling walker (2 wheels) Transfers: Sit to/from Stand Sit to Stand: Min guard           General transfer comment: verbal cues for LLE positioning and hand placement. increased time required to complete tasks    Ambulation/Gait Ambulation/Gait assistance: Min guard, Min assist Gait Distance (Feet): 15 Feet Assistive device: Rolling walker (2 wheels)   Gait velocity: decreased     General Gait Details: hop to pattern and patient able to maintain NWB LLE with functional ambulation with initial instruction. standing activity tolerance limited by fatigue. Min A required for negotiation of turning rolling walker towards the end of the walking bout. verbal cues for technique and importance of maintaining NWB and using rolling walker  Stairs            Wheelchair Mobility    Modified Rankin (Stroke Patients Only)       Balance Overall balance assessment: Needs assistance Sitting-balance support: Feet supported Sitting balance-Leahy Scale: Good     Standing balance support: Bilateral upper extremity supported, Reliant on assistive device for balance Standing balance-Leahy Scale: Poor Standing balance comment: patient required rolling walker for UE support to maintain standing balance and NWB of LLE.  Pertinent Vitals/Pain Pain Assessment Pain Assessment: 0-10 Pain Score:  6  Pain Location: L knee Pain Descriptors / Indicators: Sore Pain Intervention(s): Limited activity within patient's tolerance, Monitored during session, Repositioned, RN gave pain meds during session    [place] expects to be discharged to:: Private residence Living Arrangements: Spouse/significant other;Children Available Help at Discharge: Family;Available 24 hours/day Type of Home: House Home Access: Stairs to enter Entrance Stairs-Rails: None Entrance Stairs-Number of Steps: 3 front; side door 2   Home Layout: One level Home Equipment: Shower seat - built in      Prior Function Prior Level of Function : Independent/Modified Independent;Driving                     Hand Dominance   Dominant Hand: Left    Extremity/Trunk Assessment   Upper Extremity Assessment Upper Extremity Assessment: Defer to OT evaluation    Lower Extremity Assessment Lower Extremity Assessment: LLE deficits/detail (RLE WFL) LLE Deficits / Details: L knee flexion to ~75 degrees observed with functional activity. patient able to activate foot and hip movement also LLE Sensation: decreased light touch (she reports her toes feel numb but improving)       Communication   Communication: No difficulties  Cognition Arousal/Alertness: Awake/alert Behavior During Therapy: WFL for tasks assessed/performed Overall Cognitive Status: Within Functional Limits for tasks assessed                                          General Comments General comments (skin integrity, edema, etc.): educated patient on fall risks at home with using rolling walker over floor transitions, over throw rugs, etc.    Exercises     Assessment/Plan    PT Assessment Patient needs continued PT services  PT Problem List Decreased strength;Decreased range of motion;Decreased activity tolerance;Decreased balance;Decreased mobility;Pain;Decreased safety awareness;Decreased knowledge of  precautions;Decreased knowledge of use of DME       PT Treatment Interventions DME instruction;Stair training;Gait training;Functional mobility training;Therapeutic exercise;Therapeutic activities;Balance training;Neuromuscular re-education;Cognitive remediation;Patient/family education;Wheelchair mobility training    PT Goals (Current goals can be found in the Care Plan section)  Acute Rehab PT Goals Patient Stated Goal: to return home PT Goal Formulation: With patient Time For Goal Achievement: 09/29/22 Potential to Achieve Goals: Good    Frequency Min 5X/week     Co-evaluation PT/OT/SLP Co-Evaluation/Treatment: Yes Reason for Co-Treatment: To address functional/ADL transfers PT goals addressed during session: Mobility/safety with mobility         AM-PAC PT "6 Clicks" Mobility  Outcome Measure Help needed turning from your back to your side while in a flat bed without using bedrails?: A Little Help needed moving from lying on your back to sitting on the side of a flat bed without using bedrails?: A Little Help needed moving to and from a bed to a chair (including a wheelchair)?: A Little Help needed standing up from a chair using your arms (e.g., wheelchair or bedside chair)?: A Little Help needed to walk in hospital room?: A Little Help needed climbing 3-5 steps with a railing? : A Lot 6 Click Score: 17    End of Session Equipment Utilized During Treatment: Gait belt Activity Tolerance: Patient tolerated treatment well Patient left: in chair;with call bell/phone within reach;with chair alarm set Nurse Communication: Mobility status PT Visit Diagnosis: Difficulty in walking, not elsewhere classified (R26.2);Muscle weakness (generalized) (M62.81)  Time: 8416-6063 PT Time Calculation (min) (ACUTE ONLY): 33 min   Charges:   PT Evaluation $PT Eval Low Complexity: 1 Low PT Treatments $Gait Training: 8-22 mins        Donna Bernard, PT, MPT   Ina Homes 09/22/2022, 10:24 AM

## 2022-09-22 NOTE — Evaluation (Signed)
Occupational Therapy Evaluation Patient Details Name: Erika Smith MRN: [id number] DOB: Jun 06, 1956 Today's Date: 09/22/2022   History of Present Illness Patient is a 67 year old female presents after fall with left tibial plateau fracture s/p ORIF with anterior compartment fasciotomy.   Clinical Impression   Pt in bed upon therapy arrival and agreeable to participate in OT evaluation. Pt reports that prior to admit, she was independent with all ADL tasks, living with her Husband and her 18 y/o daughter. Currently, she presents with NWB LLE WB restrictions  due to recent ORIF causing her to require slightly increased physical assistance. Overall, she is moving fairly well using the RW and is able to maintain her WB restrictions safely. Recommend one more OT session to review AE for LB ADL tasks prior to returning home. Recommended BSC at home and pt verbalized understanding.      Recommendations for follow up therapy are one component of a multi-disciplinary discharge planning process, led by the attending physician.  Recommendations may be updated based on patient status, additional functional criteria and insurance authorization.   Follow Up Recommendations  No OT follow up     Assistance Recommended at Discharge PRN  Patient can return home with the following A little help with walking and/or transfers;A little help with bathing/dressing/bathroom;Help with stairs or ramp for entrance;Assist for transportation;Assistance with cooking/housework    Functional Status Assessment  Patient has had a recent decline in their functional status and demonstrates the ability to make significant improvements in function in a reasonable and predictable amount of time.  Equipment Recommendations  BSC/3in1       Precautions / Restrictions Precautions Precautions: Fall Restrictions Weight Bearing Restrictions: Yes LLE Weight Bearing: Non weight bearing Other Position/Activity Restrictions: ROM  knee brace LLE. Ok to work on [employer] without brace with therapist supervision.      Mobility Bed Mobility Overal bed mobility: Needs Assistance Bed Mobility: Supine to Sit     Supine to sit: Min guard     General bed mobility comments: verbal cues for technique. increased time required. no increased pain reported with bed mobility and increased knee flexion with functional activity Patient Response: Cooperative  Transfers Overall transfer level: Needs assistance Equipment used: Rolling walker (2 wheels) Transfers: Sit to/from Stand Sit to Stand: Min guard       General transfer comment: verbal cues for LLE positioning and hand placement. increased time required to complete tasks      Balance Overall balance assessment: Needs assistance Sitting-balance support: Feet unsupported, No upper extremity supported Sitting balance-Leahy Scale: Good Sitting balance - Comments: sitting on EOB to don sock on right foot   Standing balance support: Bilateral upper extremity supported, Reliant on assistive device for balance Standing balance-Leahy Scale: Poor Standing balance comment: patient required rolling walker for UE support to maintain standing balance and NWB of LLE.       ADL either performed or assessed with clinical judgement   ADL Overall ADL's : Needs assistance/impaired Eating/Feeding: Independent;Sitting   Grooming: Wash/dry face;Wash/dry hands;Oral care;Min guard;Standing   Upper Body Bathing: Set up;Sitting   Lower Body Bathing: Minimal assistance;Sitting/lateral leans;Sit to/from stand   Upper Body Dressing : Set up;Sitting   Lower Body Dressing: Minimal assistance;Sit to/from stand;Sitting/lateral leans   Toilet Transfer: Minimal assistance;Rolling walker (2 wheels);BSC/3in1;Stand-pivot   Toileting- Clothing Manipulation and Hygiene: Minimal assistance;Sit to/from stand;Sitting/lateral lean         Vision Baseline Vision/History: 0 No visual  deficits Ability to See in  Adequate Light: 0 Adequate Patient Visual Report: No change from baseline Vision Assessment?: No apparent visual deficits            Pertinent Vitals/Pain Pain Assessment Pain Assessment: 0-10 Pain Score: 6  Pain Location: L knee Pain Descriptors / Indicators: Sore Pain Intervention(s): Limited activity within patient's tolerance, Monitored during session, Repositioned, RN gave pain meds during session     Hand Dominance Left   Extremity/Trunk Assessment Upper Extremity Assessment Upper Extremity Assessment: Overall WFL for tasks assessed   Lower Extremity Assessment Lower Extremity Assessment: Defer to PT evaluation LLE Deficits / Details: L knee flexion to ~75 degrees observed with functional activity. patient able to activate foot and hip movement also LLE Sensation: decreased light touch (she reports her toes feel numb but improving)       Communication Communication Communication: No difficulties   Cognition Arousal/Alertness: Awake/alert Behavior During Therapy: WFL for tasks assessed/performed Overall Cognitive Status: Within Functional Limits for tasks assessed       General Comments  Suggested use of BSC when returning home due to NWB status to assist with using the bathroom at night as well as providing assist to get up off commode during the day.            Home Living Family/patient expects to be discharged to:: Private residence Living Arrangements: Spouse/significant other;Children Available Help at Discharge: Family;Available 24 hours/day Type of Home: House Home Access: Stairs to enter [employer] of Steps: 3 front; side door 2 Entrance Stairs-Rails: None Home Layout: One level     Bathroom Shower/Tub: Tub/shower unit;Walk-in shower   Bathroom Toilet: Standard Bathroom Accessibility: Yes How Accessible: Accessible via walker (reports that bathroom doors are narrow) Home Equipment: Shower seat - built  in          Prior Functioning/Environment Prior Level of Function : Independent/Modified Independent;Driving            OT Problem List: Decreased strength;Impaired balance (sitting and/or standing)      OT Treatment/Interventions: Self-care/ADL training;Balance training;Therapeutic exercise;Therapeutic activities;Energy conservation;DME and/or AE instruction;Manual therapy;Patient/family education    OT Goals(Current goals can be found in the care plan section) Acute Rehab OT Goals Patient Stated Goal: to get stronger OT Goal Formulation: With patient Time For Goal Achievement: 09/24/22 Potential to Achieve Goals: Good  OT Frequency: Min 2X/week    Co-evaluation PT/OT/SLP Co-Evaluation/Treatment: Yes Reason for Co-Treatment: To address functional/ADL transfers PT goals addressed during session: Mobility/safety with mobility OT goals addressed during session: ADL's and self-care;Proper use of [employer] equipment and DME;Strengthening/ROM      AM-PAC OT "6 Clicks" Daily Activity     Outcome Measure Help from another person eating meals?: None Help from another person taking care of personal grooming?: A Little Help from another person toileting, which includes using toliet, bedpan, or urinal?: A Little Help from another person bathing (including washing, rinsing, drying)?: A Little Help from another person to put on and taking off regular upper body clothing?: None Help from another person to put on and taking off regular lower body clothing?: A Little 6 Click Score: 20   End of Session Equipment Utilized During Treatment: Gait belt;Rolling walker (2 wheels) Nurse Communication: Patient requests pain meds  Activity Tolerance: Patient tolerated treatment well Patient left: in chair;with call bell/phone within reach  OT Visit Diagnosis: History of falling (Z91.81);Muscle weakness (generalized) (M62.81)                Time: 4193-7902 OT Time Calculation (min): 33  min Charges:  OT General Charges $OT Visit: 1 Visit OT Evaluation $OT Eval Moderate Complexity: 1 Mod  Ailene Ravel, OTR/L,CBIS  Supplemental OT - [facility] and [facility] Secure Chat Preferred    Lyn Deemer, Clarene Duke 09/22/2022, 10:44 AM

## 2022-09-22 NOTE — Progress Notes (Signed)
Orthopaedic Trauma Service Progress Note  Patient ID: Erika Smith MRN: [id number] DOB/AGE: Nov 15, 1955 67 y.o.  Subjective:  Doing much better this am  Pain better controlled No specific complaints  About to work with therapies  No nausea or vomiting Hydrocodone is working well and she is tolerating.  Lives with her husband and 18 year old daughter  No headaches or lightheadedness No dizziness No shortness of breath or chest pain   ROS As above Objective:   VITALS:   Vitals:   09/21/22 1847 09/21/22 1924 09/21/22 2348 09/22/22 0347  BP: (!) 149/63 (!) 149/69 130/67 (!) 128/56  Pulse: 97 (!) 103 93 80  Resp: 16 17 18 18   Temp: 98.7 F (37.1 C) 98.9 F (37.2 C) 98.8 F (37.1 C) 98.1 F (36.7 C)  TempSrc: Oral Oral Oral Oral  SpO2: 95% 99% 95% 96%  Weight:  69.2 kg    Height:  5' (1.524 m)      Estimated body mass index is 29.79 kg/m as calculated from the following:   Height as of this encounter: 5' (1.524 m).   Weight as of this encounter: 69.2 kg.   Intake/Output      01/02 0701 01/03 0700 01/03 0701 01/04 0700   I.V. (mL/kg) 1000 (14.5)    Total Intake(mL/kg) 1000 (14.5)    Urine (mL/kg/hr) 650    Blood 50    Total Output 700    Net +300         Urine Occurrence 4 x      LABS  Results for orders placed or performed during the hospital encounter of 09/21/22 (from the past 24 hour(s))  Glucose, capillary     Status: None   Collection Time: 09/21/22 12:22 PM  Result Value Ref Range   Glucose-Capillary 97 70 - 99 mg/dL  Comprehensive metabolic panel     Status: Abnormal   Collection Time: 09/21/22  6:55 PM  Result Value Ref Range   Sodium 137 135 - 145 mmol/L   Potassium 4.1 3.5 - 5.1 mmol/L   Chloride 102 98 - 111 mmol/L   CO2 25 22 - 32 mmol/L   Glucose, Bld 153 (H) 70 - 99 mg/dL   BUN 18 8 - 23 mg/dL   Creatinine, Ser 0.69 0.44 - 1.00 mg/dL   Calcium 8.6  (L) 8.9 - 10.3 mg/dL   Total Protein 6.0 (L) 6.5 - 8.1 g/dL   Albumin 3.3 (L) 3.5 - 5.0 g/dL   AST 31 15 - 41 U/L   ALT 19 0 - 44 U/L   Alkaline Phosphatase 74 38 - 126 U/L   Total Bilirubin 0.9 0.3 - 1.2 mg/dL   GFR, Estimated >60 >60 mL/min   Anion gap 10 5 - 15  CBC     Status: Abnormal   Collection Time: 09/21/22  6:55 PM  Result Value Ref Range   WBC 10.8 (H) 4.0 - 10.5 K/uL   RBC 3.15 (L) 3.87 - 5.11 MIL/uL   Hemoglobin 10.3 (L) 12.0 - 15.0 g/dL   HCT 31.2 (L) 36.0 - 46.0 %   MCV 99.0 80.0 - 100.0 fL   MCH 32.7 26.0 - 34.0 pg   MCHC 33.0 30.0 - 36.0 g/dL   RDW 12.6 11.5 - 15.5 %   Platelets 201 150 - 400 K/uL  nRBC 0.0 0.0 - 0.2 %  CBC     Status: Abnormal   Collection Time: 09/22/22  2:50 AM  Result Value Ref Range   WBC 7.3 4.0 - 10.5 K/uL   RBC 3.07 (L) 3.87 - 5.11 MIL/uL   Hemoglobin 9.8 (L) 12.0 - 15.0 g/dL   HCT 09.3 (L) 26.7 - 12.4 %   MCV 97.4 80.0 - 100.0 fL   MCH 31.9 26.0 - 34.0 pg   MCHC 32.8 30.0 - 36.0 g/dL   RDW 58.0 99.8 - 33.8 %   Platelets 189 150 - 400 K/uL   nRBC 0.0 0.0 - 0.2 %  VITAMIN D 25 Hydroxy (Vit-D Deficiency, Fractures)     Status: Abnormal   Collection Time: 09/22/22  5:33 AM  Result Value Ref Range   Vit D, 25-Hydroxy 28.04 (L) 30 - 100 ng/mL     PHYSICAL EXAM:   Gen: Sitting up in bed, no acute distress, very pleasant.  Appears well Lungs: Unlabored Cardiac: Regular Ext:        Left lower extremity  Compressive dressing from foot to thigh is clean, dry and intact  Minimal swelling  Extremity warm  + DP pulses noted  No DCT  Compartments are soft  No pain out of proportion with passive stretching of her digits  EHL, FHL, lesser toe motor functions intact  Ankle flexion, extension, inversion and eversion intact   Assessment/Plan: 1 Day Post-Op   Principal Problem:   Closed bicondylar fracture of left tibial plateau   Anti-infectives (From admission, onward)    Start     Dose/Rate Route Frequency Ordered Stop    09/21/22 2300  ceFAZolin (ANCEF) IVPB 1 g/50 mL premix        1 g 100 mL/hr over 30 Minutes Intravenous Every 6 hours 09/21/22 1805 09/22/22 1659   09/21/22 1415  ceFAZolin (ANCEF) IVPB 2g/100 mL premix        2 g 200 mL/hr over 30 Minutes Intravenous  Once 09/21/22 1408 09/21/22 1530   09/21/22 1405  ceFAZolin (ANCEF) 2-4 GM/100ML-% IVPB       Note to Pharmacy: Marijo Sanes B: cabinet override      09/21/22 1405 09/21/22 1525     .  POD/HD#: 67  67 year old female ground-level fall with left bicondylar tibial plateau fracture  -Left bicondylar tibial plateau fracture s/p ORIF  Weightbearing Nonweightbearing left leg for 6 weeks    Mobilize using a walker   ROM/Activity   Unrestricted range of motion of left knee   Hinged knee brace when up mobilizing   Up out of bed as tolerated with assistance   Will get zero knee bone foam for patient to use when in bed   Wound care   Dressing changes prior to discharge     Sutures out in about 2 weeks   Ok to bathe but keep dressing clean and dry for the time being   Ice and elevate for swelling and pain control  Therapy evals  PT/OT- please teach HEP for R knee ROM- AROM, PROM. Prone exercises as well. No ROM restrictions.  Quad sets, SLR, LAQ, SAQ, heel slides, stretching, prone flexion and extension  Ankle theraband program, heel cord stretching, toe towel curls, etc  No pillows under bend of knee when at rest, ok to place under heel to help work on extension. Can also use zero knee bone foam if available DO NOT LET KNEE REST IN FLEXION!!!!!  Hinged knee brace on at  all times, unlocked.  Ok to work on [employer] without brace with therapist supervision.  Brace back on after ROM session if removed  - Pain management:  Multimodal  Seems to be tolerating hydrocodone well  Monitor and titrate accordingly  - ABL anemia/Hemodynamics  Stable  Monitor  - Medical issues   Home meds  - DVT/PE prophylaxis:  On Lovenox currently  Start  Eliquis tomorrow x 30 days - ID:   Perioperative antibiotics - Metabolic Bone Disease:  Vitamin D insufficiency noted   Supplement  Fractures suggestive of fragility fracture  Recommend bone density scan in the next 4 to 8 weeks  - Activity:  As above - FEN/GI prophylaxis/Foley/Lines:  NSL  Reg diet  No foley, no purewick  - Impediments to fracture healing:  Vitamin D insufficiency  Low-energy fracture indicative of fragility fracture  - Dispo:  Therapy evaluations  Possible home tomorrow     Jari Pigg, PA-C [phone] (C) 09/22/2022, 9:09 AM  [facility] [place] [id number] [phone] Jenetta [phone] (F)    After 5pm and on the weekends please log on to Amion, go to orthopaedics and the look under the [facility] Call for the provider(s) on call. You can also call our office at [phone] and then follow the prompts to be connected to the call team.  Patient ID: Blanchard Mane, female   DOB: 1956-07-05, 67 y.o.   MRN: [id number]

## 2022-09-22 NOTE — TOC Benefit Eligibility Note (Signed)
Patient Teacher, English as a foreign language completed.    The patient is currently admitted and upon discharge could be taking Eliquis 2.5 mg.  The current 30 day co-pay is $45.00.   The patient is insured through [employer], [place] Patient Advocate Specialist [place] Patient Advocate Team Direct Number: [phone]  Fax: [phone]

## 2022-09-22 NOTE — Anesthesia Postprocedure Evaluation (Signed)
Anesthesia Post Note  Patient: Erika Smith  Procedure(s) Performed: OPEN REDUCTION INTERNAL FIXATION (ORIF) TIBIAL PLATEAU (Left)     Patient location during evaluation: PACU Anesthesia Type: General Level of consciousness: awake and alert Pain management: pain level controlled Vital Signs Assessment: post-procedure vital signs reviewed and stable Respiratory status: spontaneous breathing, nonlabored ventilation, respiratory function stable and patient connected to nasal cannula oxygen Cardiovascular status: blood pressure returned to baseline and stable Postop Assessment: no apparent nausea or vomiting Anesthetic complications: no  No notable events documented.  Last Vitals:  Vitals:   09/21/22 2348 09/22/22 0347  BP: 130/67 (!) 128/56  Pulse: 93 80  Resp: 18 18  Temp: 37.1 C 36.7 C  SpO2: 95% 96%    Last Pain:  Vitals:   09/22/22 0557  TempSrc:   PainSc: 5    Pain Goal: Patients Stated Pain Goal: 5 (09/21/22 1745)                 Natasia Sanko

## 2022-09-22 NOTE — Progress Notes (Signed)
Orthopedic Tech Progress Note Patient Details:  Erika Smith Mar 20, 1956 [id number]  Ortho Devices Type of Ortho Device: Bone foam zero knee Ortho Device/Splint Location: LLE Ortho Device/Splint Interventions: Ordered, Application, Adjustment   Post Interventions Patient Tolerated: Well Instructions Provided: Care of device  Janit Pagan 09/22/2022, 10:37 AM

## 2022-09-22 NOTE — Progress Notes (Signed)
During overnight [employer] tech came by to apply ROM knee brace pt refused due to pain was too severe. Informed to notify ortho tech when pain is improved to apply. Contacted ortho tech this am. They will notify [employer] personnel on site to place this am. If not on unit this am advised to call ortho tech again. Also informed that pt is out of age range for trapeze bar.

## 2022-09-23 LAB — CBC
HCT: 23.3 % — ABNORMAL LOW (ref 36.0–46.0)
Hemoglobin: 7.8 g/dL — ABNORMAL LOW (ref 12.0–15.0)
MCH: 32.6 pg (ref 26.0–34.0)
MCHC: 33.5 g/dL (ref 30.0–36.0)
MCV: 97.5 fL (ref 80.0–100.0)
Platelets: 183 10*3/uL (ref 150–400)
RBC: 2.39 MIL/uL — ABNORMAL LOW (ref 3.87–5.11)
RDW: 12.9 % (ref 11.5–15.5)
WBC: 6.6 10*3/uL (ref 4.0–10.5)
nRBC: 0 % (ref 0.0–0.2)

## 2022-09-23 LAB — PREPARE RBC (CROSSMATCH)

## 2022-09-23 MED ORDER — SODIUM CHLORIDE 0.9% IV SOLUTION: Freq: Once | INTRAVENOUS | Status: AC

## 2022-09-23 MED ORDER — ACETAMINOPHEN 325 MG PO TABS
650.0000 mg | ORAL_TABLET | Freq: Once | ORAL | Status: AC
  Administered 2022-09-23: 650 mg via ORAL
  Filled 2022-09-23: qty 2

## 2022-09-23 MED ORDER — FUROSEMIDE 10 MG/ML IJ SOLN
20.0000 mg | Freq: Once | INTRAMUSCULAR | Status: AC
  Administered 2022-09-23: 20 mg via INTRAVENOUS
  Filled 2022-09-23: qty 2

## 2022-09-23 MED ORDER — DIPHENHYDRAMINE HCL 25 MG PO CAPS
25.0000 mg | ORAL_CAPSULE | Freq: Once | ORAL | Status: AC
  Administered 2022-09-23: 25 mg via ORAL
  Filled 2022-09-23: qty 1

## 2022-09-23 NOTE — TOC Initial Note (Addendum)
Transition of Care [facility]) - Initial/Assessment Note    Patient Details  Name: Erika Smith MRN: [id number] Date of Birth: 1956-07-20  [facility]) CM/SW Contact:    Tom-Johnson, Renea Ee, RN Phone Number: 09/23/2022, 12:07 PM  Clinical Narrative:                  CM spoke with patient at bedside about needs for post hospital transition. Admitted for Left Tibial Plateau Closed Bicondylar Fracture and underwent ORIF and Anterior Compartment Fasciotomy on 09/21/22.  Patient is from home with husband and 80 yr old daughter. States husband is not helpful, daughter assists most of the time.  Home health referral called in to [facility] per patient's request, Calvin noted acceptance and info on AVS.  BSC, RW and Manual W/C ordered from [employer]. Insurance will only pay for either w/c or RW. CM notified patient and she agreed to pay out of pocket for the RW if she cannot find someone to loan her their walker. Erasmo Downer with [employer] notified and will f/u with patient.  Husband to transport at discharge. CM will continue to follow as patient progresses with care towards discharge.    12:40- CM notified by Erasmo Downer with [employer] that patient declined RW and BSC. W/C will be delivered to patient.  CM will continue to follow.      Expected Discharge Plan: [place] Barriers to Discharge: Continued Medical Work up   Patient Goals and CMS Choice Patient states their goals for this hospitalization and ongoing recovery are:: To return home CMS Medicare.gov Compare Post Acute Care list provided to:: Patient   [place] ownership interest in [facility].provided to:: Patient    Expected Discharge Plan and Services   Discharge Planning Services: CM Consult Post Acute Care Choice: [place] arrangements for the past 2 months: [place]                 DME Arranged: Bedside commode, Walker rolling, Wheelchair manual DME Agency:  [facility] Date DME Agency Contacted: 09/23/22 Time DME Agency Contacted: [id number] Representative spoke with at DME Agency: Krisin HH Arranged: PT [place]: [facility] Date [place]: 09/23/22 Time [place]: 1134 Representative spoke with at [place]: Thomas Arrangements/Services Living arrangements for the past 2 months: [place] with:: Spouse, Adult Children Patient language and need for interpreter reviewed:: Yes Do you feel safe going back to the place where you live?: Yes      Need for Family Participation in Patient Care: Yes (Comment) Care giver support system in place?: Yes (comment)   Criminal Activity/Legal Involvement Pertinent to Current Situation/Hospitalization: No - Comment as needed  Activities of Daily Living Home Assistive Devices/Equipment: None ADL Screening (condition at time of admission) Patient's cognitive ability adequate to safely complete daily activities?: Yes Is the patient deaf or have difficulty hearing?: No Does the patient have difficulty seeing, even when wearing glasses/contacts?: No Does the patient have difficulty concentrating, remembering, or making decisions?: No Patient able to express need for assistance with ADLs?: Yes Does the patient have difficulty dressing or bathing?: No Independently performs ADLs?: No Communication: Independent Dressing (OT): Needs assistance Is this a change from baseline?: Change from baseline, expected to last >3 days Grooming: Independent Feeding: Independent Bathing: Needs assistance Is this a change from baseline?: Change from baseline, expected to last >3 days Toileting: Needs assistance Is this a change from baseline?: Change  from baseline, expected to last >3days In/Out Bed: Needs assistance Is this a change from baseline?: Change from baseline, expected to last >3 days Walks in Home: Needs assistance Is this a change from baseline?: Change from  baseline, expected to last >3 days Does the patient have difficulty walking or climbing stairs?: Yes Weakness of Legs: Left Weakness of Arms/Hands: None  Permission Sought/Granted Permission sought to share information with : Case Manager, Customer service manager, Family Supports Permission granted to share information with : Yes, Verbal Permission Granted              Emotional Assessment Appearance:: Appears stated age Attitude/Demeanor/Rapport: Engaged, Gracious Affect (typically observed): Accepting, Appropriate, Calm, Hopeful, Pleasant Orientation: : Oriented to Self, Oriented to Place, Oriented to  Time, Oriented to Situation Alcohol / Substance Use: Not Applicable Psych Involvement: No (comment)  Admission diagnosis:  Closed bicondylar fracture of left tibial plateau [S82.142A] Patient Active Problem List   Diagnosis Date Noted   Closed bicondylar fracture of left tibial plateau 09/21/2022   Closed fracture of left tibial plateau 09/19/2022   Coronary atherosclerosis 01/01/2022   Chronic venous insufficiency 11/04/2021   Leg pain 10/16/2021   Left groin pain 10/16/2021   Mixed incontinence 10/16/2021   Cystitis 10/16/2021   History of adenomatous polyp of colon 03/22/2021   Centrilobular emphysema ([place]) 12/02/2020   Osteopenia 10/27/2020   Aortic atherosclerosis ([place]) 10/24/2020   Pre-diabetes 09/18/2019   Gastric catarrh 09/18/2019   Back ache 09/18/2019   Cannot sleep 09/18/2019   Transient cerebral ischemia 09/18/2019   Symptomatic states associated with artificial menopause 09/18/2019   Stricture and stenosis of esophagus 09/18/2019   Personal history of other diseases of the digestive system 09/18/2019   Family history of ischemic heart disease 09/18/2019   Fatigue 09/18/2019   TOBACCO ABUSE 12/18/2007   Chronic airway obstruction, not elsewhere classified 12/18/2007   Hyperlipidemia 12/15/2007   PCP:  Waylan Rocher, MD Pharmacy:    [id number] [address], [place] [address] Phone: [phone] Fax: [phone]     Social Determinants of Health (SDOH) Social History: SDOH Screenings   Food Insecurity: No Food Insecurity (09/21/2022)  Housing: Low Risk  (09/21/2022)  Transportation Needs: No Transportation Needs (09/21/2022)  Utilities: Not At Risk (09/21/2022)  Alcohol Screen: Low Risk  (10/08/2021)  Depression (PHQ2-9): Low Risk  (10/08/2021)  Financial Resource Strain: Low Risk  (10/08/2021)  Physical Activity: Insufficiently Active (10/08/2021)  Social Connections: Moderately Integrated (10/08/2021)  Stress: No Stress Concern Present (10/08/2021)  Tobacco Use: Medium Risk (09/22/2022)   SDOH Interventions: Transportation Interventions: Intervention Not Indicated, Inpatient TOC, Patient Resources (Friends/Family)   Readmission Risk Interventions     No data to display

## 2022-09-23 NOTE — Discharge Instructions (Addendum)
Orthopaedic Trauma Service Discharge Instructions   General Discharge Instructions  Orthopaedic Injuries:  Left tibial plateau fracture treated with open reduction internal fixation using plate and screws  WEIGHT BEARING STATUS: Nonweightbearing left leg.  Use walker or crutches to mobilize.  Use wheelchair for long distances   RANGE OF MOTION/ACTIVITY: Unrestricted range of motion of left knee.  Do not let knee rest in a partially bent position when in bed or chair.  When not working on range of motion the knee is to be all the way straight using bone foam with propping up pillows under the ankle.  Alternatively you can let your knee bend all the way to 90 degrees when sitting in a chair or wheelchair if you are able to achieve that.  Continue to work on motion as taught to you by therapy.  Continue with home exercise program that therapy taught you doing it at least 2 times a day preferably 3-4 times a day.  Ice after the sessions  Bone health: Labs show vitamin D insufficiency.  Recommend supplementation with vitamin D3 5000 IUs daily.  Prescription has been sent and filled for you.  Would also recommend follow-up at the osteoporosis clinic to further evaluate your bone health.  Review the following resource for additional information regarding bone health  [email or web address].cy  Wound Care: Daily wound care starting on 09/27/2022.  Okay to leave open to the air if there is no drainage.  Okay to shower if there is no drainage clean with soap and water only   Discharge Wound Care Instructions  Do NOT apply any ointments, solutions or lotions to pin sites or surgical wounds.  These prevent needed drainage and even though solutions like hydrogen peroxide kill bacteria, they also damage cells lining the pin sites that help fight infection.  Applying lotions or ointments can keep the wounds moist and can cause them to breakdown and open up as well. This can increase the  risk for infection. When in doubt call the office.  Surgical incisions should be dressed daily.  If any drainage is noted, use one layer of adaptic or Mepitel, then gauze, Kerlix, and an ace wrap.  Alternatively you can use a silicone foam dressing such as a Mepilex which is what you have on and a compression sock.  Compression socks can be obtained for [employer] or any other drugstore.  These will help with swelling control.  [email or web address] [email or web address]_[email or web address]&[email or web address]  [email or web address]  These dressing supplies should be available at local medical supply stores ([facility], [facility], etc). They are not usually carried at places like [facility], [facility], [facility], etc  Once the incision is completely dry and without drainage, it may be left open to air out.  Showering may begin 36-48 hours later.  Cleaning gently with soap and water.   DVT/PE prophylaxis: Eliquis 2.5 mg every 12 hours x 30 days   Diet: as you were eating previously.  Can use over the counter stool softeners and bowel preparations, such as Miralax, to help with bowel movements.  Narcotics can be constipating.  Be sure to drink plenty of fluids  PAIN MEDICATION USE AND EXPECTATIONS  You have likely been given narcotic medications to help control your pain.  After a traumatic event that results in an fracture (broken bone) with or without surgery, it is ok to use narcotic pain medications to help control one's pain.  We understand that everyone responds to pain differently and each individual patient will be evaluated on a regular  basis for the continued need for narcotic medications. Ideally, narcotic medication use should last no more than 6-8 weeks (coinciding with fracture healing).   As a patient it is your  responsibility as well to monitor narcotic medication use and report the amount and frequency you use these medications when you come to your office visit.   We would also advise that if you are using narcotic medications, you should take a dose prior to therapy to maximize you participation.  IF YOU ARE ON NARCOTIC MEDICATIONS IT IS NOT PERMISSIBLE TO OPERATE A MOTOR VEHICLE (MOTORCYCLE/CAR/TRUCK/MOPED) OR HEAVY MACHINERY DO NOT MIX NARCOTICS WITH OTHER CNS (CENTRAL NERVOUS SYSTEM) DEPRESSANTS SUCH AS ALCOHOL   POST-OPERATIVE OPIOID TAPER INSTRUCTIONS: It is important to wean off of your opioid medication as soon as possible. If you do not need pain medication after your surgery it is ok to stop day one. Opioids include: Codeine, Hydrocodone(Norco, Vicodin), Oxycodone(Percocet, oxycontin) and hydromorphone amongst others.  Long term and even short term use of opiods can cause: Increased pain response Dependence Constipation Depression Respiratory depression And more.  Withdrawal symptoms can include Flu like symptoms Nausea, vomiting And more Techniques to manage these symptoms Hydrate well Eat regular healthy meals Stay active Use relaxation techniques(deep breathing, meditating, yoga) Do Not substitute Alcohol to help with tapering If you have been on opioids for less than two weeks and do not have pain than it is ok to stop all together.  Plan to wean off of opioids This plan should start within one week post op of your fracture surgery  Maintain the same interval or time between taking each dose and first decrease the dose.  Cut the total daily intake of opioids by one tablet each day Next start to increase the time between doses. The last dose that should be eliminated is the evening dose.    STOP SMOKING OR USING NICOTINE PRODUCTS!!!!  As discussed nicotine severely impairs your body's ability to heal surgical and traumatic wounds but also impairs bone healing.  Wounds  and bone heal by forming microscopic blood vessels (angiogenesis) and nicotine is a vasoconstrictor (essentially, shrinks blood vessels).  Therefore, if vasoconstriction occurs to these microscopic blood vessels they essentially disappear and are unable to deliver necessary nutrients to the healing tissue.  This is one modifiable factor that you can do to dramatically increase your chances of healing your injury.    (This means no smoking, no nicotine gum, patches, etc)  DO NOT USE NONSTEROIDAL ANTI-INFLAMMATORY DRUGS (NSAID'S)  Using products such as Advil (ibuprofen), Aleve (naproxen), Motrin (ibuprofen) for additional pain control during fracture healing can delay and/or prevent the healing response.  If you would like to take over the counter (OTC) medication, Tylenol (acetaminophen) is ok.  However, some narcotic medications that are given for pain control contain acetaminophen as well. Therefore, you should not exceed more than 4000 mg of tylenol in a day if you do not have liver disease.  Also note that there are may OTC medicines, such as cold medicines and allergy medicines that my contain tylenol as well.  If you have any questions about medications and/or interactions please ask your doctor/PA or your pharmacist.      ICE AND ELEVATE INJURED/OPERATIVE EXTREMITY  Using ice and elevating the injured extremity above your heart can help with swelling and pain control.  Icing in a pulsatile fashion, such as 20 minutes on and 20 minutes off, can be followed.    Do not place ice directly  on skin. Make sure there is a barrier between to skin and the ice pack.    Using frozen items such as frozen peas works well as the conform nicely to the are that needs to be iced.  USE AN ACE WRAP OR TED HOSE FOR SWELLING CONTROL  In addition to icing and elevation, Ace wraps or TED hose are used to help limit and resolve swelling.  It is recommended to use Ace wraps or TED hose until you are informed to stop.     When using Ace Wraps start the wrapping distally (farthest away from the body) and wrap proximally (closer to the body)   Example: If you had surgery on your leg or thing and you do not have a splint on, start the ace wrap at the toes and work your way up to the thigh        If you had surgery on your upper extremity and do not have a splint on, start the ace wrap at your fingers and work your way up to the upper arm  IF YOU ARE IN A SPLINT OR CAST DO NOT [place]   If your splint gets wet for any reason please contact the office immediately. You may shower in your splint or cast as long as you keep it dry.  This can be done by wrapping in a cast cover or garbage back (or similar)  Do Not stick any thing down your splint or cast such as pencils, money, or hangers to try and scratch yourself with.  If you feel itchy take benadryl as prescribed on the bottle for itching  IF YOU ARE IN A CAM BOOT (BLACK BOOT)  You may remove boot periodically. Perform daily dressing changes as noted below.  Wash the liner of the boot regularly and wear a sock when wearing the boot. It is recommended that you sleep in the boot until told otherwise    Call office for the following: Temperature greater than 101F Persistent nausea and vomiting Severe uncontrolled pain Redness, tenderness, or signs of infection (pain, swelling, redness, odor or green/yellow discharge around the site) Difficulty breathing, headache or visual disturbances Hives Persistent dizziness or light-headedness Extreme fatigue Any other questions or concerns you may have after discharge  In an emergency, call 911 or go to an Emergency Department at a nearby hospital  HELPFUL INFORMATION  If you had a block, it will wear off between 8-24 hrs postop typically.  This is period when your pain may go from nearly zero to the pain you would have had postop without the block.  This is an abrupt transition but nothing dangerous is  happening.  You may take an extra dose of narcotic when this happens.  You should wean off your narcotic medicines as soon as you are able.  Most patients will be off or using minimal narcotics before their first postop appointment.   We suggest you use the pain medication the first night prior to going to bed, in order to ease any pain when the anesthesia wears off. You should avoid taking pain medications on an empty stomach as it will make you nauseous.  Do not drink alcoholic beverages or take illicit drugs when taking pain medications.  In most states it is against the law to drive while you are in a splint or sling.  And certainly against the law to drive while taking narcotics.  You may return to work/school in the next couple  of days when you feel up to it.   Pain medication may make you constipated.  Below are a few solutions to try in this order: Decrease the amount of pain medication if you aren't having pain. Drink lots of decaffeinated fluids. Drink prune juice and/or each dried prunes  If the first 3 don't work start with additional solutions Take Colace - an over-the-counter stool softener Take Senokot - an over-the-counter laxative Take Miralax - a stronger over-the-counter laxative     CALL THE OFFICE WITH ANY QUESTIONS OR CONCERNS: [phone]   VISIT OUR WEBSITE FOR ADDITIONAL INFORMATION: [email or web address]     Information on my medicine - ELIQUIS (apixaban)  This medication education was reviewed with me or my healthcare representative as part of my discharge preparation.    Why was Eliquis prescribed for you? Eliquis was prescribed for you to reduce the risk of blood clots forming after orthopedic surgery.    What do You need to know about Eliquis? Take your Eliquis TWICE DAILY - one tablet in the morning and one tablet in the evening with or without food.  It would be best to take the dose about the same time each day.  If you have difficulty  swallowing the tablet whole please discuss with your pharmacist how to take the medication safely.  Take Eliquis exactly as prescribed by your doctor and DO NOT stop taking Eliquis without talking to the doctor who prescribed the medication.  Stopping without other medication to take the place of Eliquis may increase your risk of developing a clot.  After discharge, you should have regular check-up appointments with your healthcare provider that is prescribing your Eliquis.  What do you do if you miss a dose? If a dose of ELIQUIS is not taken at the scheduled time, take it as soon as possible on the same day and twice-daily administration should be resumed.  The dose should not be doubled to make up for a missed dose.  Do not take more than one tablet of ELIQUIS at the same time.  Important Safety Information A possible side effect of Eliquis is bleeding. You should call your healthcare provider right away if you experience any of the following: Bleeding from an injury or your nose that does not stop. Unusual colored urine (red or dark brown) or unusual colored stools (red or black). Unusual bruising for unknown reasons. A serious fall or if you hit your head (even if there is no bleeding).  Some medicines may interact with Eliquis and might increase your risk of bleeding or clotting while on Eliquis. To help avoid this, consult your healthcare provider or pharmacist prior to using any new prescription or non-prescription medications, including herbals, vitamins, non-steroidal anti-inflammatory drugs (NSAIDs) and supplements.  This website has more information on Eliquis (apixaban): http://www.eliquis.com/eliquis/home

## 2022-09-23 NOTE — Progress Notes (Signed)
Occupational Therapy Treatment and discharge Patient Details Name: TANYLAH SCHNOEBELEN MRN: [id number] DOB: 10/18/1955 Today's Date: 09/23/2022   History of present illness Patient is a 67 year old female presents after fall with left tibial plateau fracture s/p ORIF with anterior compartment fasciotomy.   OT comments  Pt in bed upon therapy arrival receiving blood due to low platelet count. Bed level treatment provided pertaining to patient education on adaptive equipment available to assist with LB bathing and dressing if needed. Provided information on use of reacher, long handled sponge, and sock aid with visual demonstration and verbal instructions. Pt verbalized understanding. No further acute OT needs at this time; all education has been completed and will sign off.    Recommendations for follow up therapy are one component of a multi-disciplinary discharge planning process, led by the attending physician.  Recommendations may be updated based on patient status, additional functional criteria and insurance authorization.    Follow Up Recommendations  No OT follow up     Assistance Recommended at Discharge PRN  Patient can return home with the following  A little help with walking and/or transfers;A little help with bathing/dressing/bathroom;Help with stairs or ramp for entrance;Assist for transportation;Assistance with cooking/housework   Equipment Recommendations  BSC/3in1       Precautions / Restrictions Precautions Precautions: Fall Restrictions Weight Bearing Restrictions: Yes LLE Weight Bearing: Non weight bearing Other Position/Activity Restrictions: ROM knee brace LLE. Ok to work on [employer] without brace with therapist supervision.       Mobility Bed Mobility Overal bed mobility:  (deferred due to receiving blood)      Transfers Overall transfer level:  (Not completed this date due to medical reasons)            ADL either performed or assessed with clinical  judgement   ADL     General ADL Comments: patient education provided regarding use of AE such as reacher, long handled sponge, and sock aid to assist with LB bathing and dressing tasks. Pt provided with visual demonstration, verbal instructions, and handout for reference.      Cognition Arousal/Alertness: Awake/alert Behavior During Therapy: WFL for tasks assessed/performed Overall Cognitive Status: Within Functional Limits for tasks assessed                       Pertinent Vitals/ Pain       Pain Assessment Pain Assessment: No/denies pain            Progress Toward Goals  OT Goals(current goals can now be found in the care plan section)  Progress towards OT goals: Goals met/education completed, patient discharged from [place] All goals met and education completed, patient discharged from OT services       AM-PAC OT "6 Clicks" Daily Activity     Outcome Measure     Help from another person taking care of personal grooming?: A Little Help from another person toileting, which includes using toliet, bedpan, or urinal?: A Little Help from another person bathing (including washing, rinsing, drying)?: A Little Help from another person to put on and taking off regular upper body clothing?: None Help from another person to put on and taking off regular lower body clothing?: A Little 6 Click Score: 16    End of Session    OT Visit Diagnosis: History of falling (Z91.81);Muscle weakness (generalized) (M62.81)   Activity Tolerance Patient tolerated treatment well   Patient Left in bed;with call bell/phone  within reach           Time: 1412-1420 OT Time Calculation (min): 8 min  Charges: OT General Charges $OT Visit: 1 Visit OT Treatments $Self Care/Home Management : 8-22 mins  Ailene Ravel, OTR/L,CBIS  Supplemental OT - [facility] and [facility] Secure Chat Preferred    Angelyn Osterberg, Clarene Duke 09/23/2022, 3:10 PM

## 2022-09-23 NOTE — Progress Notes (Signed)
    Durable Medical Equipment  (From admission, onward)           Start     Ordered   09/23/22 1031  For home use only DME standard manual wheelchair with seat cushion  Once       Comments: Patient suffers from left tibial plateau fracture which impairs their ability to perform daily activities like dressing, feeding, grooming, and toileting in the home.  A cane or crutch will not resolve issue with performing activities of daily living. A wheelchair will allow patient to safely perform daily activities. Patient can safely propel the wheelchair in the home or has a caregiver who can provide assistance. Length of need 6 months . Accessories: elevating leg rests (ELRs), wheel locks, extensions and anti-tippers.   09/23/22 1031   09/23/22 0919  For home use only DME Walker rolling  Once       Question Answer Comment  Walker: With [place]   Patient needs a walker to treat with the following condition Gait instability      09/23/22 0919   09/23/22 0917  For home use only DME Bedside commode  Once       Comments: Patient is NWB LLE WB restrictions  due to recent ORIF causing her to require slightly increased physical assistance with decline in their functional status.  Question:  Patient needs a bedside commode to treat with the following condition  Answer:  Weakness   09/23/22 0919

## 2022-09-23 NOTE — Progress Notes (Signed)
    Durable Medical Equipment  (From admission, onward)           Start     Ordered   09/23/22 0919  For home use only DME Walker rolling  Once       Question Answer Comment  Walker: With [place] Whe[employer]   Patient needs a walker to treat with the following condition Gait instability      09/23/22 0919   09/23/22 0917  For home use only DME Bedside commode  Once       Comments: Patient is NWB LLE WB restrictions  due to recent ORIF causing her to require slightly increased physical assistance with decline in their functional status.  Question:  Patient needs a bedside commode to treat with the following condition  Answer:  Weakness   09/23/22 0919

## 2022-09-23 NOTE — Progress Notes (Signed)
Orthopaedic Trauma Service Progress Note  Patient ID: Erika Smith MRN: [id number] DOB/AGE: 05-26-1956 67 y.o.  Subjective:  Doing better but dizzy and lightheaded when getting up  Pain controlled  No complaints   ROS As above  Objective:   VITALS:   Vitals:   09/22/22 1419 09/22/22 1935 09/23/22 0343 09/23/22 0715  BP: 94/78 (!) 105/52 (!) 110/52 (!) 101/46  Pulse: 93 82 81 75  Resp:  18 17   Temp: 98.4 F (36.9 C) 98.9 F (37.2 C) 98.8 F (37.1 C) 98.7 F (37.1 C)  TempSrc: Oral Oral Oral Oral  SpO2: 98% 95% 96% 94%  Weight:      Height:        Estimated body mass index is 29.79 kg/m as calculated from the following:   Height as of this encounter: 5' (1.524 m).   Weight as of this encounter: 69.2 kg.   Intake/Output      01/03 0701 01/04 0700 01/04 0701 01/05 0700   I.V. (mL/kg)     Total Intake(mL/kg)     Urine (mL/kg/hr)     Blood     Total Output     Net            LABS  Results for orders placed or performed during the hospital encounter of 09/21/22 (from the past 24 hour(s))  CBC     Status: Abnormal   Collection Time: 09/23/22  3:31 AM  Result Value Ref Range   WBC 6.6 4.0 - 10.5 K/uL   RBC 2.39 (L) 3.87 - 5.11 MIL/uL   Hemoglobin 7.8 (L) 12.0 - 15.0 g/dL   HCT 23.3 (L) 36.0 - 46.0 %   MCV 97.5 80.0 - 100.0 fL   MCH 32.6 26.0 - 34.0 pg   MCHC 33.5 30.0 - 36.0 g/dL   RDW 12.9 11.5 - 15.5 %   Platelets 183 150 - 400 K/uL   nRBC 0.0 0.0 - 0.2 %     PHYSICAL EXAM:   Gen: Sitting up in bed, no acute distress, very pleasant.  Appears well Lungs: Unlabored Cardiac: Regular Ext:        Left lower extremity             Compressive dressing from foot to thigh is clean, dry and intact   Hinged brace has been adjusted but it is currently off             Minimal swelling             Extremity warm             + DP pulses noted             No DCT              Compartments are soft             No pain out of proportion with passive stretching of her digits             EHL, FHL, lesser toe motor functions intact             Ankle flexion, extension, inversion and eversion intact  Assessment/Plan: 2 Days Post-Op     Anti-infectives (From admission, onward)    Start     Dose/Rate Route Frequency Ordered Stop  09/21/22 2300  ceFAZolin (ANCEF) IVPB 1 g/50 mL premix        1 g 100 mL/hr over 30 Minutes Intravenous Every 6 hours 09/21/22 1805 09/22/22 1130   09/21/22 1415  ceFAZolin (ANCEF) IVPB 2g/100 mL premix        2 g 200 mL/hr over 30 Minutes Intravenous  Once 09/21/22 1408 09/21/22 1530   09/21/22 1405  ceFAZolin (ANCEF) 2-4 GM/100ML-% IVPB       Note to Pharmacy: Mendel Corning B: cabinet override      09/21/22 1405 09/21/22 1525     .  POD/HD#: 75  67 year old female ground-level fall with left bicondylar tibial plateau fracture   -Left bicondylar tibial plateau fracture s/p ORIF             Weightbearing Nonweightbearing left leg for 6 weeks                                     Mobilize using a walker               ROM/Activity                         Unrestricted range of motion of left knee                         Hinged knee brace when up mobilizing                         Up out of bed as tolerated with assistance                         Will get zero knee bone foam for patient to use when in bed               Wound care                         Dressing changes prior to discharge                                       Sutures out in about 2 weeks                         Ok to bathe but keep dressing clean and dry for the time being               Ice and elevate for swelling and pain control             Therapy evals   PT/OT- please teach HEP for R knee ROM- AROM, PROM. Prone exercises as well. No ROM restrictions.  Quad sets, SLR, LAQ, SAQ, heel slides, stretching, prone flexion and extension   Ankle theraband program,  heel cord stretching, toe towel curls, etc   No pillows under bend of knee when at rest, ok to place under heel to help work on extension. Can also use zero knee bone foam if available DO NOT LET KNEE REST IN FLEXION!!!!!   Hinged knee brace on at all times, unlocked.  Ok to work on [employer] without brace with therapist supervision.  Brace back on after ROM  session if removed   - Pain management:             Multimodal   - ABL anemia/Hemodynamics             symptomatic    Transfuse 2 units PRBCs   - Medical issues              Home meds   - DVT/PE prophylaxis:             eliquis x 30 days  - ID:              Perioperative antibiotics completed  - Metabolic Bone Disease:             Vitamin D insufficiency noted                         Supplement             Fractures suggestive of fragility fracture             Recommend bone density scan in the next 4 to 8 weeks   - Activity:             As above  - FEN/GI prophylaxis/Foley/Lines:             NSL             Reg diet             No foley, no purewick   - Impediments to fracture healing:             Vitamin D insufficiency             Low-energy fracture indicative of fragility fracture   - Dispo:             Therapies              Possible home tomorrow   Mearl Latin, PA-C [phone] (C) 09/23/2022, 10:30 AM  [facility] [address] [place] [address] [phone] Val [phone] (F)    After 5pm and on the weekends please log on to Amion, go to orthopaedics and the look under the [facility] Call for the provider(s) on call. You can also call our office at [phone] and then follow the prompts to be connected to the call team.  Patient ID: Erika Smith, female   DOB: Jan 29, 1956, 67 y.o.   MRN: [id number]

## 2022-09-23 NOTE — Progress Notes (Signed)
Mobility Specialist Progress Note   09/23/22 1746  Mobility  Activity Transferred to/from [facility]  Level of Assistance Standby assist, set-up cues, supervision of patient - no hands on  Assistive Device Front wheel walker  Distance Ambulated (ft) 2 ft  LLE Weight Bearing NWB  Activity Response Tolerated well  Mobility Referral Yes  $Mobility charge 1 Mobility   Received pt on [facility] requesting assistance back to bed. Pt requiring no assistance to get back to bed and adhering to NWB status well. Left in bed w/o fault, call bell in reach and needs met.   Holland Falling Mobility Specialist Please contact via SecureChat or  Rehab office at [phone]

## 2022-09-23 NOTE — Progress Notes (Signed)
Physical Therapy Treatment Patient Details Name: Erika Smith MRN: [id number] DOB: 03-17-1956 Today's Date: 09/23/2022   History of Present Illness Patient is a 67 year old female presents after fall with left tibial plateau fracture s/p ORIF with anterior compartment fasciotomy.    PT Comments    Patient is making progress with functional independence. Transfer training continued today from various surfaces including bed, bed side commode, and recliner chair. Patient is able to maintain NWB of LLE with functional mobility with occasional cues for positioning. Patient ambulated a short distance with the rolling walker with activity tolerance limited by fatigue and mild dizziness. Of note, patient now has a temporary ramp installed to enter her home and stairs no longer required for home entry. Recommend to continue PT to maximize independence and decrease caregiver burden.    Recommendations for follow up therapy are one component of a multi-disciplinary discharge planning process, led by the attending physician.  Recommendations may be updated based on patient status, additional functional criteria and insurance authorization.  Follow Up Recommendations  Home health PT     Assistance Recommended at Discharge Intermittent Supervision/Assistance  Patient can return home with the following A little help with walking and/or transfers;Assist for transportation;Help with stairs or ramp for entrance;Assistance with cooking/housework   Equipment Recommendations  Rolling walker (2 wheels);BSC/3in1;Wheelchair (measurements PT);Wheelchair cushion (measurements PT)    Recommendations for Other Services       Precautions / Restrictions Precautions Precautions: Fall Restrictions Weight Bearing Restrictions: Yes LLE Weight Bearing: Non weight bearing Other Position/Activity Restrictions: ROM knee brace LLE. Ok to work on [employer] without brace with therapist supervision.     Mobility  Bed  Mobility               General bed mobility comments: not observed as patient sitting up on arrival and post session    Transfers Overall transfer level: Needs assistance Equipment used: Rolling walker (2 wheels) Transfers: Sit to/from Stand Sit to Stand: Min assist           General transfer comment: verbal cues for hand placement and LLE positioning with sitting to maintain NWB. transfer training performed from bed, bed side commode, and recliner chair. mild dizziness is reported with activity. Also educated patient on car transfer techniques.     Ambulation/Gait Ambulation/Gait assistance: Min guard Gait Distance (Feet): 5 Feet Assistive device: Rolling walker (2 wheels)         General Gait Details: hop to pattern and patient able to maintain NWB LLE with functional ambulation with initial instruction. standing activity tolerance limited by fatigue and mild dizziness with upright activity. of note, hemoglobin is 7.8 and patient is anticipated to receive blood   Stairs Stairs:  (discussed home set-up again, and patient reports that she now has a ramp installed at home. stairs no longer required for home entry and exit)           Wheelchair Mobility    Modified Rankin (Stroke Patients Only)       Balance Overall balance assessment: Needs assistance Sitting-balance support: Feet unsupported, No upper extremity supported Sitting balance-Leahy Scale: Good                                      Cognition Arousal/Alertness: Awake/alert Behavior During Therapy: WFL for tasks assessed/performed Overall Cognitive Status: Within Functional Limits for tasks assessed  Exercises      General Comments        Pertinent Vitals/Pain Pain Assessment Pain Assessment: Faces Faces Pain Scale: Hurts a little bit Pain Location: L knee Pain Descriptors / Indicators: Sore Pain Intervention(s):  Limited activity within patient's tolerance, Monitored during session, Ice applied    Home Living                          Prior Function            PT Goals (current goals can now be found in the care plan section) Acute Rehab PT Goals Patient Stated Goal: to return home PT Goal Formulation: With patient Time For Goal Achievement: 09/29/22 Potential to Achieve Goals: Good Progress towards PT goals: Progressing toward goals    Frequency    Min 5X/week      PT Plan Current plan remains appropriate    Co-evaluation              AM-PAC PT "6 Clicks" Mobility   Outcome Measure  Help needed turning from your back to your side while in a flat bed without using bedrails?: A Little Help needed moving from lying on your back to sitting on the side of a flat bed without using bedrails?: A Little Help needed moving to and from a bed to a chair (including a wheelchair)?: A Little Help needed standing up from a chair using your arms (e.g., wheelchair or bedside chair)?: A Little Help needed to walk in hospital room?: A Little Help needed climbing 3-5 steps with a railing? : A Lot 6 Click Score: 17    End of Session   Activity Tolerance: Patient tolerated treatment well Patient left: in chair;with call bell/phone within reach Nurse Communication: Mobility status PT Visit Diagnosis: Difficulty in walking, not elsewhere classified (R26.2);Muscle weakness (generalized) (M62.81)     Time: 6222-9798 PT Time Calculation (min) (ACUTE ONLY): 25 min  Charges:  $Therapeutic Activity: 23-37 mins                    Minna Merritts, PT, MPT    Percell Locus 09/23/2022, 1:35 PM

## 2022-09-23 NOTE — TOC CAGE-AID Note (Signed)
Transition of Care [facility]) - CAGE-AID Screening   Patient Details  Name: Erika Smith MRN: [id number] Date of Birth: 09-06-1956  [facility]) CM/SW Contact:    Army Melia, RN Phone Number:[phone] 09/23/2022, 10:53 PM  CAGE-AID Screening:    Have You Ever Felt You Ought to Cut Down on Your Drinking or Drug Use?: No Have People Annoyed You By Critizing Your Drinking Or Drug Use?: No Have You Felt Bad Or Guilty About Your Drinking Or Drug Use?: No Have You Ever Had a Drink or Used Drugs First Thing In The Morning to Steady Your Nerves or to Get Rid of a Hangover?: No CAGE-AID Score: 0  Substance Abuse Education Offered: No

## 2022-09-23 NOTE — Progress Notes (Signed)
Pt blood started w/o issues or complaints.  Vitals are WNLs.  Will reassess.

## 2022-09-24 ENCOUNTER — Encounter (HOSPITAL_COMMUNITY): Payer: Self-pay | Admitting: Orthopedic Surgery

## 2022-09-24 ENCOUNTER — Other Ambulatory Visit (HOSPITAL_COMMUNITY): Payer: Self-pay

## 2022-09-24 LAB — CBC
HCT: 33.9 % — ABNORMAL LOW (ref 36.0–46.0)
Hemoglobin: 12 g/dL (ref 12.0–15.0)
MCH: 31.9 pg (ref 26.0–34.0)
MCHC: 35.4 g/dL (ref 30.0–36.0)
MCV: 90.2 fL (ref 80.0–100.0)
Platelets: 222 10*3/uL (ref 150–400)
RBC: 3.76 MIL/uL — ABNORMAL LOW (ref 3.87–5.11)
RDW: 15.7 % — ABNORMAL HIGH (ref 11.5–15.5)
WBC: 6.9 10*3/uL (ref 4.0–10.5)
nRBC: 0 % (ref 0.0–0.2)

## 2022-09-24 LAB — BPAM RBC
Blood Product Expiration Date: 202401262359
Blood Product Expiration Date: 202401262359
ISSUE DATE / TIME: 202401041257
ISSUE DATE / TIME: 202401042147
Unit Type and Rh: 6200
Unit Type and Rh: 6200

## 2022-09-24 LAB — TYPE AND SCREEN
ABO/RH(D): A POS
Antibody Screen: NEGATIVE
Unit division: 0
Unit division: 0

## 2022-09-24 LAB — URINE CULTURE: Culture: 30000 — AB

## 2022-09-24 LAB — HEMOGLOBIN A1C
Hgb A1c MFr Bld: 6.3 % — ABNORMAL HIGH (ref 4.8–5.6)
Mean Plasma Glucose: 134 mg/dL

## 2022-09-24 MED ORDER — DOCUSATE SODIUM 100 MG PO CAPS
100.0000 mg | ORAL_CAPSULE | Freq: Two times a day (BID) | ORAL | 0 refills | Status: DC
  Filled 2022-09-24: qty 20, 10d supply, fill #0

## 2022-09-24 MED ORDER — APIXABAN 2.5 MG PO TABS
2.5000 mg | ORAL_TABLET | Freq: Two times a day (BID) | ORAL | 0 refills | Status: DC
  Filled 2022-09-24: qty 60, 30d supply, fill #0

## 2022-09-24 MED ORDER — ACETAMINOPHEN 500 MG PO TABS
500.0000 mg | ORAL_TABLET | Freq: Two times a day (BID) | ORAL | 0 refills | Status: AC
  Filled 2022-09-24: qty 60, 30d supply, fill #0

## 2022-09-24 MED ORDER — CHOLECALCIFEROL 125 MCG (5000 UT) PO TABS
1.0000 | ORAL_TABLET | Freq: Every day | ORAL | 6 refills | Status: AC
  Filled 2022-09-24: qty 30, 30d supply, fill #0

## 2022-09-24 MED ORDER — METHOCARBAMOL 500 MG PO TABS
500.0000 mg | ORAL_TABLET | Freq: Three times a day (TID) | ORAL | 0 refills | Status: DC | PRN
  Filled 2022-09-24: qty 60, 10d supply, fill #0

## 2022-09-24 MED ORDER — BISACODYL 5 MG PO TBEC
5.0000 mg | DELAYED_RELEASE_TABLET | Freq: Every day | ORAL | 0 refills | Status: DC | PRN
  Filled 2022-09-24: qty 15, 15d supply, fill #0

## 2022-09-24 MED ORDER — ASCORBIC ACID 1000 MG PO TABS
1000.0000 mg | ORAL_TABLET | Freq: Every day | ORAL | 0 refills | Status: AC
  Filled 2022-09-24: qty 30, 30d supply, fill #0

## 2022-09-24 MED ORDER — HYDROCODONE-ACETAMINOPHEN 5-325 MG PO TABS
1.0000 | ORAL_TABLET | Freq: Four times a day (QID) | ORAL | 0 refills | Status: DC | PRN
  Filled 2022-09-24: qty 50, 7d supply, fill #0

## 2022-09-24 MED ORDER — CALCIUM CARBONATE 1500 (600 CA) MG PO TABS
1.0000 | ORAL_TABLET | Freq: Two times a day (BID) | ORAL | 1 refills | Status: AC
  Filled 2022-09-24 (×2): qty 30, 15d supply, fill #0

## 2022-09-24 NOTE — Progress Notes (Signed)
Mobility Specialist Progress Note   09/24/22 1039  Mobility  Activity Ambulated with assistance in hallway  Level of Assistance Minimal assist, patient does 75% or more  Assistive Device Front wheel walker  Distance Ambulated (ft) 60 ft  LLE Weight Bearing NWB  Activity Response Tolerated well  Mobility Referral Yes  $Mobility charge 1 Mobility   Received in bed c/o slight pain in LLE (4/10) but agreeable. Pt requiring no physical assistance for in room ambulation but pt's UE's fatiguing fairly quickly w/ ambulation in hallway, x3 seated rest break w/ x1 LOB d/t RUE "giving out" according to pt. Pt able to stabilize self and maintain NWB status w/ minA. Rolled back to room in chair w/o incident, call bell in reach and bed alarm on.   Holland Falling Mobility Specialist Please contact via [place] or  Rehab office at [phone]

## 2022-09-24 NOTE — Progress Notes (Signed)
Discharge summary packet provided to pt with instructions. Pt verbalized understanding of instructions. [facility] pharmacy to deliver meds to unit/room. Pt d/c to home as ordered. No complaints. Per pt her family is responsible for her ride back home.

## 2022-09-24 NOTE — Care Management Important Message (Signed)
Important Message  Patient Details  Name: Erika Smith MRN: [id number] Date of Birth: 1956/01/27   Medicare Important Message Given:  Yes     Hannah Beat 09/24/2022, 1:03 PM

## 2022-09-24 NOTE — Progress Notes (Signed)
Physical Therapy Treatment Patient Details Name: Erika Smith MRN: [id number] DOB: 10-Jan-1956 Today's Date: 09/24/2022   History of Present Illness Patient is a 67 year old female presents after fall with left tibial plateau fracture s/p ORIF with anterior compartment fasciotomy. PMH:   GERD, Hyperlipidemia  , prediabetes, urinary incontinence, L shoulder injury.    PT Comments    Pt received in recliner, agreeable to therapy session with good tolerance for gait, transfer training and HEP instruction. Discussion on use of hinged knee brace anytime getting up to mobilize here or at home to protect knee and also with ROM for joint stability, HEP brought to room to reinforce safe technique until [facility] therapies start up. Pt reports improved comfort using junior/youth height RW, recommend she obtain one out of pocket to use at home as the one she planned to borrow is a standard height and would be likely to cause discomfort being too high for her. Pt needing up to min guard and mainly limited due to UE fatigue, good compliance with LLE NWB throughout. Pt continues to benefit from PT services to progress toward functional mobility goals.    Recommendations for follow up therapy are one component of a multi-disciplinary discharge planning process, led by the attending physician.  Recommendations may be updated based on patient status, additional functional criteria and insurance authorization.  Follow Up Recommendations  Home health PT     Assistance Recommended at Discharge Intermittent Supervision/Assistance  Patient can return home with the following A little help with walking and/or transfers;Assist for transportation;Help with stairs or ramp for entrance;Assistance with cooking/housework   Equipment Recommendations  Rolling walker (2 wheels);BSC/3in1;Wheelchair (measurements PT);Wheelchair cushion (measurements PT) (youth size RW, per case mgr pt will need to pay out of pocket)     Recommendations for Other Services       Precautions / Restrictions Precautions Precautions: Fall Required Braces or Orthoses: Other Brace Other Brace: hinged knee brace, unlocked.  Brace only needs to be on when mobilizing per PA/MD Restrictions Weight Bearing Restrictions: Yes LLE Weight Bearing: Non weight bearing Other Position/Activity Restrictions: ROM knee brace LLE. Ok to work on [employer] without brace with therapist supervision.     Mobility  Bed Mobility Overal bed mobility:  (received in recliner)                  Transfers Overall transfer level: Needs assistance Equipment used: Rolling walker (2 wheels) (regular then youth height RW) Transfers: Sit to/from Stand Sit to Stand: Min guard, Supervision           General transfer comment: verbal cues for hand placement; from recliner multiple reps to regular and youth height RW. no dizziness reported    Ambulation/Gait Ambulation/Gait assistance: Min guard Gait Distance (Feet): 30 Feet (76ft in room with regular RW, then 69ft with youth height RW which was more comfortable for her) Assistive device: Rolling walker (2 wheels) (and youth height RW) Gait Pattern/deviations: Step-to pattern       General Gait Details: hop to pattern and patient able to maintain NWB LLE with functional ambulation with initial instruction. shoulder pain limiting initial distance, also had her practice with youth height RW and she said this was much more comfortable; no LOB, discussed activity pacing and "press and sway" technique for more tricep activation rather than more arduous up and down hopping esp given history of L shoulder injury   Stairs Stairs:  (pt has a ramp already installed)  Wheelchair Mobility    Modified Rankin (Stroke Patients Only)       Balance Overall balance assessment: Needs assistance Sitting-balance support: Feet unsupported, No upper extremity supported Sitting balance-Leahy Scale:  Good     Standing balance support: Bilateral upper extremity supported, Reliant on assistive device for balance Standing balance-Leahy Scale: Fair Standing balance comment: fair with youth height RW                            Cognition Arousal/Alertness: Awake/alert Behavior During Therapy: WFL for tasks assessed/performed Overall Cognitive Status: Within Functional Limits for tasks assessed                                          Exercises General Exercises - Lower Extremity Ankle Circles/Pumps: AROM, Left, Seated, 10 reps Quad Sets: AROM, Both, 5 reps, Supine Short Arc Quad: AROM, AAROM, Left, 5 reps, Supine (recliner) Heel Slides: AAROM, Left, 10 reps, Supine (with and without brace) Hip ABduction/ADduction:  (verbal review) Straight Leg Raises: AROM, AAROM, Left, 5 reps, Supine Other Exercises Other Exercises: handout given to reinforce    General Comments General comments (skin integrity, edema, etc.): some edema to L foot and more around her L knee, discussed edema reduction strategies ice/elevation with technique to prevent knee flexion contracture      Pertinent Vitals/Pain Pain Assessment Pain Assessment: 0-10 Pain Score: 5  Pain Location: L knee Pain Descriptors / Indicators: Sore, Tightness Pain Intervention(s): Monitored during session, Premedicated before session, Repositioned, Ice applied (LLE elevated, RN notified a small ED pillow would be benefit for under calf to float her heel and improve tolerance for LLE elevation in blue bone foam)     PT Goals (current goals can now be found in the care plan section) Acute Rehab PT Goals Patient Stated Goal: to return home PT Goal Formulation: With patient Time For Goal Achievement: 09/29/22 Progress towards PT goals: Progressing toward goals    Frequency    Min 5X/week      PT Plan Current plan remains appropriate       AM-PAC PT "6 Clicks" Mobility   Outcome Measure   Help needed turning from your back to your side while in a flat bed without using bedrails?: None Help needed moving from lying on your back to sitting on the side of a flat bed without using bedrails?: None Help needed moving to and from a bed to a chair (including a wheelchair)?: A Little Help needed standing up from a chair using your arms (e.g., wheelchair or bedside chair)?: A Little Help needed to walk in hospital room?: A Little Help needed climbing 3-5 steps with a railing? : A Lot 6 Click Score: 19    End of Session Equipment Utilized During Treatment: Gait belt;Other (comment) (L hinged knee brace donned and adjusted for proper fit as knee wrap had been removed by MD) Activity Tolerance: Patient tolerated treatment well Patient left: in chair;with call bell/phone within reach Nurse Communication: Mobility status;Other (comment) (needs small pillow to help with elevation, regular size pillow is too bulky to use for elevation) PT Visit Diagnosis: Difficulty in walking, not elsewhere classified (R26.2);Muscle weakness (generalized) (M62.81)     Time: 1610-9604 PT Time Calculation (min) (ACUTE ONLY): 31 min  Charges:  $Gait Training: 8-22 mins $Therapeutic Exercise: 8-22 mins  Houston Siren., PTA Acute Rehabilitation Services Secure Chat Preferred 9a-5:30pm Office: [place] 09/24/2022, 2:46 PM

## 2022-09-24 NOTE — Discharge Summary (Signed)
Orthopaedic Trauma Service (OTS) Discharge Summary   Patient ID: Erika Smith MRN: [id number] DOB/AGE: 1956/03/28 67 y.o.  Admit date: 09/21/2022 Discharge date: 09/24/2022  Admission Diagnoses: Closed left bicondylar tibial plateau fracture  Discharge Diagnoses:  Principal Problem:   Closed bicondylar fracture of left tibial plateau Active Problems:   Prediabetes   Past Medical History:  Diagnosis Date   GERD (gastroesophageal reflux disease)    Hyperlipidemia    Prediabetes    Urinary incontinence      Procedures Performed: 09/21/2022-Dr. Marcelino Scot 1. OPEN REDUCTION INTERNAL FIXATION (ORIF) BICONDYLAR TIBIAL PLATEAU (Left) 2. ANTERIOR COMPARTMENT FASCIOTOMY 3. APPLICATION OF STRESS UNDER FLUOROSCOPY     Discharged Condition: good  Hospital Course:   Patient is a 67 year old female who sustained a fall on 09/19/2022 when she was accidentally knocked over by one of her dogs.  Patient had immediate onset of left leg pain.  She was initially brought to [facility] where she was found to have a left tibial plateau fracture patient was seen and evaluated by Dr. Thornton Park at [facility] and he requested transfer to [facility] for definitive treatment by the orthopedic trauma service as he asserted that this was outside the scope of his practice.  Patient was eventually transferred to [facility].  She was admitted to [facility] initially for monitoring for compartment syndrome.  She was unable to discharge home due to pain.  Ultimately she was taken to the operating room on 09/21/2022 for the procedure noted above.  Patient tolerated procedure well.  On postoperative day #1 she was doing okay immobilized with therapies.  Continue to progress appropriately.  On postoperative day #2 she noted that she did have some dizziness and lightheadedness when getting out of bed.  She did have a continued drop in her H&H.  Decision was made to  transfuse her with 2 units of packed red blood cells.  She tolerated transfusion well.  He was still able to participate with therapies.  She continued to progress well.  Pain well-controlled with hydrocodone and IV fentanyl for breakthrough pain as she does not tolerate many opioids.  This seems to be the most tolerated combination for her.  She also had not on opioid modalities including Robaxin, ketorolac and Tylenol.  She was initially started on Lovenox for DVT and PE prophylaxis and then was transition to Eliquis 2.5 mg twice daily for the next 30 days.  Patient was covered with Ancef for perioperative antibiosis.  Ultimately she was discharged on 09/24/2022 in stable condition.  At the time of discharge she was voiding without difficulty, did have a bowel movement prior to discharge and was tolerating regular diet.  Dressing was changed prior to discharge.  New Mepilex dressings were applied.  No compressive wrap was reapplied.  We did discuss use of compression sock if swelling becomes significant.  We discussed appropriate use of bone foam.  She was fitted with a hinged knee brace which is unlocked.  Brace only needs to be on when mobilizing.  Continue home exercise program as taught to her by physical therapy.  Patient will follow-up with orthopedic trauma service in 2 weeks for suture removal and follow-up x-rays.  She will be nonweightbearing for approximately 6 weeks.  Could consider weightbearing in a pool around the 4-week mark.  We did check some basic metabolic bone labs.  Patient was noted to have some mild vitamin D insufficiency.  She was started on appropriate supplementation.  Would  recommend outpatient follow-up in the osteoporosis clinic for further evaluation of her bone quality.  Consults: None  Significant Diagnostic Studies: labs:   Latest Reference Range & Units 09/22/22 05:33  Vitamin D, 25-Hydroxy 30 - 100 ng/mL 28.04 (L)  (L): Data is abnormally low   Latest Reference Range &  Units 09/22/22 02:50 09/23/22 03:31 09/24/22 04:14  WBC 4.0 - 10.5 K/uL 7.3 6.6 6.9  RBC 3.87 - 5.11 MIL/uL 3.07 (L) 2.39 (L) 3.76 (L)  Hemoglobin 12.0 - 15.0 g/dL 9.8 (L) 7.8 (L) 12.0  HCT 36.0 - 46.0 % 29.9 (L) 23.3 (L) 33.9 (L)  MCV 80.0 - 100.0 fL 97.4 97.5 90.2  MCH 26.0 - 34.0 pg 31.9 32.6 31.9  MCHC 30.0 - 36.0 g/dL 32.8 33.5 35.4  RDW 11.5 - 15.5 % 12.4 12.9 15.7 (H)  Platelets 150 - 400 K/uL 189 183 222  nRBC 0.0 - 0.2 % 0.0 0.0 0.0  (L): Data is abnormally low (H): Data is abnormally high   Latest Reference Range & Units 09/23/22 03:31  Hemoglobin A1C 4.8 - 5.6 % 6.3 (H)  (H): Data is abnormally high  Treatments: IV hydration, antibiotics: Ancef, analgesia: acetaminophen, norco and IV fentanyl, anticoagulation: LMW heparin and Eliquis, therapies: PT, OT, and RN, and surgery: As above  Discharge Exam:   Orthopaedic Trauma Service Progress Note   Patient ID: Erika Smith MRN: [id number] DOB/AGE: [birth year] 67 y.o.   Subjective:   Doing well this am  Went from 1650 yesterday to 0700 today without opioids. Felt that this was too long but feels better now.  Did not get any meds overnight as she was sleeping really well   No complaints  Ready to go home    + void + BM   Tolerated transfusion yesterday Feels better    ROS As above   Objective:    VITALS:         Vitals:    09/23/22 2229 09/24/22 0054 09/24/22 0459 09/24/22 0834  BP: 125/65 124/66 (!) 149/73 137/65  Pulse: 82 84 73 75  Resp: 20 20 17 18  $ Temp: 98.2 F (36.8 C) 98.5 F (36.9 C) 98.1 F (36.7 C) 98 F (36.7 C)  TempSrc: Oral Oral Oral Oral  SpO2: 96% 96% 99% 99%  Weight:          Height:              Estimated body mass index is 29.79 kg/m as calculated from the following:   Height as of this encounter: 5' (1.524 m).   Weight as of this encounter: 69.2 kg.     Intake/Output      01/04 0701 01/05 0700 01/05 0701 01/06 0700   P.O. 240 240   Blood 650.4    Total  Intake(mL/kg) 890.4 (12.9) 240 (3.5)   Net +890.4 +240        Urine Occurrence 1 x 1 x   Stool Occurrence 2 x       LABS   Lab Results Last 24 Hours        Results for orders placed or performed during the hospital encounter of 09/21/22 (from the past 24 hour(s))  Type and screen [place]     Status: None (Preliminary result)    Collection Time: 09/23/22 10:44 AM  Result Value Ref Range    ABO/RH(D) A POS      Antibody Screen NEG      Sample Expiration 09/26/2022,2359  Unit Number [id number]      Blood Component Type RBC LR PHER1      Unit division 00      Status of Unit ISSUED      Transfusion Status OK TO TRANSFUSE      Crossmatch Result          Compatible Performed at [facility], [place] [address] [place], [place] [address]      Unit Number [id number]      Blood Component Type RED CELLS,LR      Unit division 00      Status of Unit ISSUED      Transfusion Status OK TO TRANSFUSE      Crossmatch Result Compatible    Prepare RBC (crossmatch)     Status: None    Collection Time: 09/23/22 10:44 AM  Result Value Ref Range    Order Confirmation          ORDER PROCESSED BY BLOOD BANK Performed at [facility], [place] [address] [place], [place] [address]    CBC     Status: Abnormal    Collection Time: 09/24/22  4:14 AM  Result Value Ref Range    WBC 6.9 4.0 - 10.5 K/uL    RBC 3.76 (L) 3.87 - 5.11 MIL/uL    Hemoglobin 12.0 12.0 - 15.0 g/dL    HCT 33.9 (L) 36.0 - 46.0 %    MCV 90.2 80.0 - 100.0 fL    MCH 31.9 26.0 - 34.0 pg    MCHC 35.4 30.0 - 36.0 g/dL    RDW 15.7 (H) 11.5 - 15.5 %    Platelets 222 150 - 400 K/uL    nRBC 0.0 0.0 - 0.2 %          PHYSICAL EXAM:    Gen: Sitting up in bed, no acute distress, very pleasant.  Appears well Lungs: Unlabored Cardiac: Regular Ext:        Left lower extremity             Compressive dressing from foot to thigh is clean, dry and intact                         dressings removed                          Incisions look pristine                         No signs of infection             Minimal swelling             Extremity warm             + DP pulses noted             No DCT             Compartments are soft             No pain out of proportion with passive stretching of her digits             EHL, FHL, lesser toe motor functions intact             Ankle flexion, extension, inversion and eversion intact   Assessment/Plan: 3 Days Post-Op      Anti-infectives (From admission, onward)  Start     Dose/Rate Route Frequency Ordered Stop    09/21/22 2300   ceFAZolin (ANCEF) IVPB 1 g/50 mL premix        1 g 100 mL/hr over 30 Minutes Intravenous Every 6 hours 09/21/22 1805 09/22/22 1130    09/21/22 1415   ceFAZolin (ANCEF) IVPB 2g/100 mL premix        2 g 200 mL/hr over 30 Minutes Intravenous  Once 09/21/22 1408 09/21/22 1530    09/21/22 1405   ceFAZolin (ANCEF) 2-4 GM/100ML-% IVPB       Note to Pharmacy: Mendel Corning B: cabinet override         09/21/22 1405 09/21/22 1525         .   POD/HD#: 15   67 year old female ground-level fall with left bicondylar tibial plateau fracture   -Left bicondylar tibial plateau fracture s/p ORIF             Weightbearing Nonweightbearing left leg for 6 weeks                                     Mobilize using a walker               ROM/Activity                         Unrestricted range of motion of left knee                         Hinged knee brace when up mobilizing                         Up out of bed as tolerated with assistance                         Will get zero knee bone foam for patient to use when in bed               Wound care                         Dressing changed today.  Change again in 2-3 days and then leave open to air                                      Sutures out in about 2 weeks                         Ok to bathe/shower                         Clean wounds with soap and water only                 Ice and elevate for swelling and pain control             Therapy evals   PT/OT- please teach HEP for R knee ROM- AROM, PROM. Prone exercises as well. No ROM restrictions.  Quad sets, SLR, LAQ, SAQ, heel slides, stretching, prone flexion and extension   Ankle theraband program, heel cord stretching, toe towel curls, etc   No pillows under bend  of knee when at rest, ok to place under heel to help work on extension. Can also use zero knee bone foam if available DO NOT LET KNEE REST IN FLEXION!!!!!   Hinged knee brace on at all times, unlocked.  Ok to work on [employer] without brace with therapist supervision.  Brace back on after ROM session if removed   - Pain management:             Multimodal   - ABL anemia/Hemodynamics             improved             No new issues    - Medical issues              Home meds   - DVT/PE prophylaxis:             eliquis x 30 days  - ID:              Perioperative antibiotics completed   - Metabolic Bone Disease:             Vitamin D insufficiency noted                         Supplement             Fractures suggestive of fragility fracture             Recommend bone density scan in the next 4 to 8 weeks   - Activity:             As above   - FEN/GI prophylaxis/Foley/Lines:             NSL             Reg diet             No foley, no purewick   - Impediments to fracture healing:             Vitamin D insufficiency             Low-energy fracture indicative of fragility fracture   - Dispo:             Therapies              home today              Follow up with ortho in 10 days     Disposition: Discharge disposition: 01-Home or Self Care       Discharge Instructions     Call MD / Call 911   Complete by: As directed    If you experience chest pain or shortness of breath, CALL 911 and be transported to the hospital emergency room.  If you develope a fever above 101 F, pus (white drainage) or increased drainage or  redness at the wound, or calf pain, call your surgeon's office.   Constipation Prevention   Complete by: As directed    Drink plenty of fluids.  Prune juice may be helpful.  You may use a stool softener, such as Colace (over the counter) 100 mg twice a day.  Use MiraLax (over the counter) for constipation as needed.   Diet general   Complete by: As directed    Discharge instructions   Complete by: As directed    Orthopaedic Trauma Service Discharge Instructions   General Discharge Instructions  Orthopaedic Injuries:  Left tibial plateau fracture treated with open reduction internal  fixation using plate and screws  WEIGHT BEARING STATUS: Nonweightbearing left leg.  Use walker or crutches to mobilize.  Use wheelchair for long distances   RANGE OF MOTION/ACTIVITY: Unrestricted range of motion of left knee.  Do not let knee rest in a partially bent position when in bed or chair.  When not working on range of motion the knee is to be all the way straight using bone foam with propping up pillows under the ankle.  Alternatively you can let your knee bend all the way to 90 degrees when sitting in a chair or wheelchair if you are able to achieve that.  Continue to work on motion as taught to you by therapy.  Continue with home exercise program that therapy taught you doing it at least 2 times a day preferably 3-4 times a day.  Ice after the sessions  Bone health: Labs show vitamin D insufficiency.  Recommend supplementation with vitamin D3 5000 IUs daily.  Prescription has been sent and filled for you.  Would also recommend follow-up at the osteoporosis clinic to further evaluate your bone health.  Review the following resource for additional information regarding bone health  [email or web address]  Wound Care: Daily wound care starting on 09/27/2022.  Okay to leave open to the air if there is no drainage.  Okay to shower if there is no drainage clean with soap and water  only   Discharge Wound Care Instructions  Do NOT apply any ointments, solutions or lotions to pin sites or surgical wounds.  These prevent needed drainage and even though solutions like hydrogen peroxide kill bacteria, they also damage cells lining the pin sites that help fight infection.  Applying lotions or ointments can keep the wounds moist and can cause them to breakdown and open up as well. This can increase the risk for infection. When in doubt call the office.  Surgical incisions should be dressed daily.  If any drainage is noted, use one layer of adaptic or Mepitel, then gauze, Kerlix, and an ace wrap.  Alternatively you can use a silicone foam dressing such as a Mepilex which is what you have on and a compression sock.  Compression socks can be obtained for [employer] or any other drugstore.  These will help with swelling control.  [email or web address].fr [email or web address]  CheapWipes.gl  These dressing supplies should be available at local medical supply stores ([employer], [employer], etc). They are not usually carried at places like [facility], [facility], [facility], etc  Once the incision is completely dry and without drainage, it may be left open to air out.  Showering may begin 36-48 hours later.  Cleaning gently with soap and water.   DVT/PE prophylaxis: Eliquis 2.5 mg every 12 hours x 30 days   Diet: as you were eating previously.  Can use over the counter stool softeners and bowel preparations, such as Miralax, to help with bowel movements.  Narcotics can be constipating.  Be sure to drink plenty of fluids  PAIN MEDICATION USE AND EXPECTATIONS  You have likely been given narcotic medications to help control your pain.  After a traumatic event that results in an  fracture (broken bone) with or without surgery, it is ok to use narcotic pain medications to help control one's pain.  We understand that everyone responds to pain differently and each individual patient will be evaluated on a regular basis for the continued need for narcotic medications. Ideally, narcotic medication use should last no more than 6-8 weeks (coinciding with fracture healing).  As a patient it is your responsibility as well to monitor narcotic medication use and report the amount and frequency you use these medications when you come to your office visit.   We would also advise that if you are using narcotic medications, you should take a dose prior to therapy to maximize you participation.  IF YOU ARE ON NARCOTIC MEDICATIONS IT IS NOT PERMISSIBLE TO OPERATE A MOTOR VEHICLE (MOTORCYCLE/CAR/TRUCK/MOPED) OR HEAVY MACHINERY DO NOT MIX NARCOTICS WITH OTHER CNS (CENTRAL NERVOUS SYSTEM) DEPRESSANTS SUCH AS ALCOHOL   POST-OPERATIVE OPIOID TAPER INSTRUCTIONS:  It is important to wean off of your opioid medication as soon as possible. If you do not need pain medication after your surgery it is ok to stop day one.  Opioids include:  o Codeine, Hydrocodone(Norco, Vicodin), Oxycodone(Percocet, oxycontin) and hydromorphone amongst others.   Long term and even short term use of opiods can cause:  o Increased pain response  o Dependence  o Constipation  o Depression  o Respiratory depression  o And more.   Withdrawal symptoms can include  o Flu like symptoms  o Nausea, vomiting  o And more  Techniques to manage these symptoms  o Hydrate well  o Eat regular healthy meals  o Stay active  o Use relaxation techniques(deep breathing, meditating, yoga)  Do Not substitute Alcohol to help with tapering  If you have been on opioids for less than two weeks and do not have pain than it is ok to stop all together.   Plan to wean off of opioids  o This plan should start within one week post  op of your fracture surgery   o Maintain the same interval or time between taking each dose and first decrease the dose.   o Cut the total daily intake of opioids by one tablet each day  o Next start to increase the time between doses.  o The last dose that should be eliminated is the evening dose.    STOP SMOKING OR USING NICOTINE PRODUCTS!!!!  As discussed nicotine severely impairs your body's ability to heal surgical and traumatic wounds but also impairs bone healing.  Wounds and bone heal by forming microscopic blood vessels (angiogenesis) and nicotine is a vasoconstrictor (essentially, shrinks blood vessels).  Therefore, if vasoconstriction occurs to these microscopic blood vessels they essentially disappear and are unable to deliver necessary nutrients to the healing tissue.  This is one modifiable factor that you can do to dramatically increase your chances of healing your injury.    (This means no smoking, no nicotine gum, patches, etc)  DO NOT USE NONSTEROIDAL ANTI-INFLAMMATORY DRUGS (NSAID'S)  Using products such as Advil (ibuprofen), Aleve (naproxen), Motrin (ibuprofen) for additional pain control during fracture healing can delay and/or prevent the healing response.  If you would like to take over the counter (OTC) medication, Tylenol (acetaminophen) is ok.  However, some narcotic medications that are given for pain control contain acetaminophen as well. Therefore, you should not exceed more than 4000 mg of tylenol in a day if you do not have liver disease.  Also note that there are may OTC medicines, such as cold medicines and allergy medicines that my contain tylenol as well.  If you have any questions about medications and/or interactions please ask your doctor/PA or your pharmacist.      ICE AND ELEVATE INJURED/OPERATIVE EXTREMITY  Using ice and elevating the injured extremity above your heart can help with swelling and pain control.  Icing in a pulsatile fashion,  such as 20 minutes  on and 20 minutes off, can be followed.    Do not place ice directly on skin. Make sure there is a barrier between to skin and the ice pack.    Using frozen items such as frozen peas works well as the conform nicely to the are that needs to be iced.  USE AN ACE WRAP OR TED HOSE FOR SWELLING CONTROL  In addition to icing and elevation, Ace wraps or TED hose are used to help limit and resolve swelling.  It is recommended to use Ace wraps or TED hose until you are informed to stop.    When using Ace Wraps start the wrapping distally (farthest away from the body) and wrap proximally (closer to the body)   Example: If you had surgery on your leg or thing and you do not have a splint on, start the ace wrap at the toes and work your way up to the thigh        If you had surgery on your upper extremity and do not have a splint on, start the ace wrap at your fingers and work your way up to the upper arm  IF YOU ARE IN A SPLINT OR CAST DO NOT Tiro   If your splint gets wet for any reason please contact the office immediately. You may shower in your splint or cast as long as you keep it dry.  This can be done by wrapping in a cast cover or garbage back (or similar)  Do Not stick any thing down your splint or cast such as pencils, money, or hangers to try and scratch yourself with.  If you feel itchy take benadryl as prescribed on the bottle for itching  IF YOU ARE IN A CAM BOOT (BLACK BOOT)  You may remove boot periodically. Perform daily dressing changes as noted below.  Wash the liner of the boot regularly and wear a sock when wearing the boot. It is recommended that you sleep in the boot until told otherwise    Call office for the following: ? Temperature greater than 101F ? Persistent nausea and vomiting ? Severe uncontrolled pain ? Redness, tenderness, or signs of infection (pain, swelling, redness, odor or green/yellow discharge around the site) ? Difficulty breathing, headache  or visual disturbances ? Hives ? Persistent dizziness or light-headedness ? Extreme fatigue ? Any other questions or concerns you may have after discharge  In an emergency, call 911 or go to an Emergency Department at a nearby hospital  HELPFUL INFORMATION  ? If you had a block, it will wear off between 8-24 hrs postop typically.  This is period when your pain may go from nearly zero to the pain you would have had postop without the block.  This is an abrupt transition but nothing dangerous is happening.  You may take an extra dose of narcotic when this happens.  ? You should wean off your narcotic medicines as soon as you are able.  Most patients will be off or using minimal narcotics before their first postop appointment.   ? We suggest you use the pain medication the first night prior to going to bed, in order to ease any pain when the anesthesia wears off. You should avoid taking pain medications on an empty stomach as it will make you nauseous.  ? Do not drink alcoholic beverages or take illicit drugs when taking pain medications.  ? In most states it is  against the law to drive while you are in a splint or sling.  And certainly against the law to drive while taking narcotics.  ? You may return to work/school in the next couple of days when you feel up to it.   ? Pain medication may make you constipated.  Below are a few solutions to try in this order:   ? Decrease the amount of pain medication if you aren't having pain.   ? Drink lots of decaffeinated fluids.   ? Drink prune juice and/or each dried prunes   o If the first 3 don't work start with additional solutions   ? Take Colace - an over-the-counter stool softener   ? Take Senokot - an over-the-counter laxative   ? Take Miralax - a stronger over-the-counter laxative     CALL THE OFFICE WITH ANY QUESTIONS OR CONCERNS: [phone]   VISIT OUR WEBSITE FOR ADDITIONAL INFORMATION: [email or web address]   Do not put a pillow  under the knee. Place it under the heel.   Complete by: As directed    Driving restrictions   Complete by: As directed    No driving until you are off of all your pain medications provided you drive an automatic transmission   Increase activity slowly as tolerated   Complete by: As directed    Non weight bearing   Complete by: As directed    Laterality: left   Extremity: Lower   Post-operative opioid taper instructions:   Complete by: As directed    POST-OPERATIVE OPIOID TAPER INSTRUCTIONS: It is important to wean off of your opioid medication as soon as possible. If you do not need pain medication after your surgery it is ok to stop day one. Opioids include: Codeine, Hydrocodone(Norco, Vicodin), Oxycodone(Percocet, oxycontin) and hydromorphone amongst others.  Long term and even short term use of opiods can cause: Increased pain response Dependence Constipation Depression Respiratory depression And more.  Withdrawal symptoms can include Flu like symptoms Nausea, vomiting And more Techniques to manage these symptoms Hydrate well Eat regular healthy meals Stay active Use relaxation techniques(deep breathing, meditating, yoga) Do Not substitute Alcohol to help with tapering If you have been on opioids for less than two weeks and do not have pain than it is ok to stop all together.  Plan to wean off of opioids This plan should start within one week post op of your joint replacement. Maintain the same interval or time between taking each dose and first decrease the dose.  Cut the total daily intake of opioids by one tablet each day Next start to increase the time between doses. The last dose that should be eliminated is the evening dose.         Allergies as of 09/24/2022       Reactions   Codeine    Dilaudid [hydromorphone] Nausea And Vomiting   Morphine    Percocet  [oxycodone-acetaminophen]    Tramadol Hcl         Medication List     STOP taking these  medications    cyclobenzaprine 5 MG tablet Commonly known as: FLEXERIL   meloxicam 15 MG tablet Commonly known as: MOBIC   Stiolto Respimat 2.5-2.5 MCG/ACT Aers Generic drug: Tiotropium Bromide-Olodaterol       TAKE these medications    acetaminophen 500 MG tablet Commonly known as: TYLENOL Take 1 tablet (500 mg total) by mouth every 12 (twelve) hours.   apixaban 2.5 MG Tabs tablet Commonly known as: ELIQUIS Take 1 tablet (  2.5 mg total) by mouth 2 (two) times daily.   ascorbic acid 1000 MG tablet Commonly known as: VITAMIN C Take 1 tablet (1,000 mg total) by mouth daily.   atorvastatin 20 MG tablet Commonly known as: LIPITOR TAKE 1 TABLET BY MOUTH EVERY DAY   bisacodyl 5 MG EC tablet Commonly known as: DULCOLAX Take 1 tablet (5 mg total) by mouth daily as needed for moderate constipation.   calcium citrate 950 (200 Ca) MG tablet Commonly known as: CALCITRATE - dosed in mg elemental calcium Take 1 tablet (200 mg of elemental calcium total) by mouth 2 (two) times daily.   docusate sodium 100 MG capsule Commonly known as: COLACE Take 1 capsule (100 mg total) by mouth 2 (two) times daily.   HYDROcodone-acetaminophen 5-325 MG tablet Commonly known as: NORCO/VICODIN Take 1-2 tablets by mouth every 6 (six) hours as needed for moderate pain or severe pain.   methocarbamol 500 MG tablet Commonly known as: ROBAXIN Take 1-2 tablets (500-1,000 mg total) by mouth every 8 (eight) hours as needed for muscle spasms.   MULTIVITAMIN ADULT PO Take 1 tablet by mouth daily.   omeprazole 20 MG capsule Commonly known as: PRILOSEC Take 20 mg by mouth daily.   oxybutynin 5 MG 24 hr tablet Commonly known as: DITROPAN-XL TAKE 1 TABLET BY MOUTH EVERYDAY AT BEDTIME   Vitamin D 125 MCG (5000 UT) Caps Take 1 capsule by mouth daily.               Durable Medical Equipment  (From admission, onward)           Start     Ordered   09/23/22 1031  For home use only DME  standard manual wheelchair with seat cushion  Once       Comments: Patient suffers from left tibial plateau fracture which impairs their ability to perform daily activities like dressing, feeding, grooming, and toileting in the home.  A cane or crutch will not resolve issue with performing activities of daily living. A wheelchair will allow patient to safely perform daily activities. Patient can safely propel the wheelchair in the home or has a caregiver who can provide assistance. Length of need 6 months . Accessories: elevating leg rests (ELRs), wheel locks, extensions and anti-tippers.   09/23/22 1031   09/23/22 0919  For home use only DME Walker rolling  Once       Question Answer Comment  Walker: With Sabine   Patient needs a walker to treat with the following condition Gait instability      09/23/22 0919   09/23/22 0917  For home use only DME Bedside commode  Once       Comments: Patient is NWB LLE WB restrictions  due to recent ORIF causing her to require slightly increased physical assistance with decline in their functional status.  Question:  Patient needs a bedside commode to treat with the following condition  Answer:  Weakness   09/23/22 0919              Discharge Care Instructions  (From admission, onward)           Start     Ordered   09/24/22 0000  Non weight bearing       Question Answer Comment  Laterality left   Extremity Lower      09/24/22 [place] Follow up.   Specialty: Home Health Services Why: Someone will call you to schedule first home visit. Contact information: [address] [address] [phone]         Altamese Asherton, MD. Schedule an appointment as soon as possible for a visit in 2 week(s).   Specialty: Orthopedic Surgery Contact information: [place] [address] [phone]                  Discharge Instructions and Plan:  67 year old female ground-level fall with left bicondylar tibial plateau fracture s/p ORIF    Weightbearing: NWB LLE ROM: Unrestricted range of motion left knee.  Do not let knee and rest in a bent position when not working on motion Insicional and dressing care: Daily dressing changes with Mepilex or 4 x 4 gauze and tape Orthopedic device(s):  Hinged knee brace, walker and wheelchair Showering: Okay to shower and clean with soap and water only VTE prophylaxis: Eliquis 2.5 mg twice daily for 30 days Pain control: Multimodal: Tylenol, Robaxin and Norco Bone Health/Optimization: Vitamin D3 5000 IUs daily Follow - up plan: 2 weeks Contact information: [place] MD, Ainsley Spinner PA-C   Signed:  Jari Pigg, PA-C [phone] (C) 09/24/2022, 9:38 AM  [facility] [place] [address] [phone] Domingo Sep (F)

## 2022-09-24 NOTE — Progress Notes (Signed)
[facility] Progress Note  Patient ID: Erika Smith MRN: [id number] DOB/AGE: 1956/05/23 67 y.o.  Subjective:  Doing well this am  Went from 1650 yesterday to 0700 today without opioids. Felt that this was too long but feels better now.  Did not get any meds overnight as she was sleeping really well  No complaints  Ready to go home   + void + BM  Tolerated transfusion yesterday Feels better   ROS As above  Objective:   VITALS:   Vitals:   09/23/22 2229 09/24/22 0054 09/24/22 0459 09/24/22 0834  BP: 125/65 124/66 (!) 149/73 137/65  Pulse: 82 84 73 75  Resp: 20 20 17 18   Temp: 98.2 F (36.8 C) 98.5 F (36.9 C) 98.1 F (36.7 C) 98 F (36.7 C)  TempSrc: Oral Oral Oral Oral  SpO2: 96% 96% 99% 99%  Weight:      Height:        Estimated body mass index is 29.79 kg/m as calculated from the following:   Height as of this encounter: 5' (1.524 m).   Weight as of this encounter: 69.2 kg.   Intake/Output      01/04 0701 01/05 0700 01/05 0701 01/06 0700   P.O. 240 240   Blood 650.4    Total Intake(mL/kg) 890.4 (12.9) 240 (3.5)   Net +890.4 +240        Urine Occurrence 1 x 1 x   Stool Occurrence 2 x      LABS  Results for orders placed or performed during the hospital encounter of 09/21/22 (from the past 24 hour(s))  Type and screen [facility]     Status: None (Preliminary result)   Collection Time: 09/23/22 10:44 AM  Result Value Ref Range   ABO/RH(D) A POS    Antibody Screen NEG    Sample Expiration 09/26/2022,2359    Unit Number 11/25/2022    Blood Component Type RBC LR PHER1    Unit division 00    Status of Unit ISSUED    Transfusion Status OK TO TRANSFUSE    Crossmatch Result      Compatible Performed at [facility], [address] [place], [place]    Unit Number [id number]    Blood Component Type RED CELLS,LR     Unit division 00    Status of Unit ISSUED    Transfusion Status OK TO TRANSFUSE    Crossmatch Result Compatible   Prepare RBC (crossmatch)     Status: None   Collection Time: 09/23/22 10:44 AM  Result Value Ref Range   Order Confirmation      ORDER PROCESSED BY BLOOD BANK Performed at [facility], [address] [place], [place]   CBC     Status: Abnormal   Collection Time: 09/24/22  4:14 AM  Result Value Ref Range   WBC 6.9 4.0 - 10.5 K/uL   RBC 3.76 (L) 3.87 - 5.11 MIL/uL   Hemoglobin 12.0 12.0 - 15.0 g/dL   HCT 11/23/22 (L) 00.9 - 38.1 %   MCV 90.2 80.0 - 100.0 fL   MCH 31.9 26.0 - 34.0 pg   MCHC 35.4 30.0 - 36.0 g/dL   RDW 82.9 (H) 93.7 - 16.9 %   Platelets 222 150 -  400 K/uL   nRBC 0.0 0.0 - 0.2 %     PHYSICAL EXAM:   Gen: Sitting up in bed, no acute distress, very pleasant.  Appears well Lungs: Unlabored Cardiac: Regular Ext:        Left lower extremity             Compressive dressing from foot to thigh is clean, dry and intact                         dressings removed   Incisions look pristine   No signs of infection             Minimal swelling             Extremity warm             + DP pulses noted             No DCT             Compartments are soft             No pain out of proportion with passive stretching of her digits             EHL, FHL, lesser toe motor functions intact             Ankle flexion, extension, inversion and eversion intact  Assessment/Plan: 3 Days Post-Op    Anti-infectives (From admission, onward)    Start     Dose/Rate Route Frequency Ordered Stop   09/21/22 2300  ceFAZolin (ANCEF) IVPB 1 g/50 mL premix        1 g 100 mL/hr over 30 Minutes Intravenous Every 6 hours 09/21/22 1805 09/22/22 1130   09/21/22 1415  ceFAZolin (ANCEF) IVPB 2g/100 mL premix        2 g 200 mL/hr over 30 Minutes Intravenous  Once 09/21/22 1408 09/21/22 1530   09/21/22 1405  ceFAZolin (ANCEF) 2-4 GM/100ML-% IVPB       Note to  Pharmacy: Marijo Sanes B: cabinet override      09/21/22 1405 09/21/22 1525     .  POD/HD#: 8  67 year old female ground-level fall with left bicondylar tibial plateau fracture   -Left bicondylar tibial plateau fracture s/p ORIF             Weightbearing Nonweightbearing left leg for 6 weeks                                     Mobilize using a walker               ROM/Activity                         Unrestricted range of motion of left knee                         Hinged knee brace when up mobilizing                         Up out of bed as tolerated with assistance                         Will get zero knee bone foam for patient to use when in bed  Wound care                         Dressing changed today.  Change again in 2-3 days and then leave open to air                                      Sutures out in about 2 weeks                         Ok to bathe/shower   Clean wounds with soap and water only                Ice and elevate for swelling and pain control             Therapy evals   PT/OT- please teach HEP for R knee ROM- AROM, PROM. Prone exercises as well. No ROM restrictions.  Quad sets, SLR, LAQ, SAQ, heel slides, stretching, prone flexion and extension   Ankle theraband program, heel cord stretching, toe towel curls, etc   No pillows under bend of knee when at rest, ok to place under heel to help work on extension. Can also use zero knee bone foam if available DO NOT LET KNEE REST IN FLEXION!!!!!   Hinged knee brace on at all times, unlocked.  Ok to work on [employer] without brace with therapist supervision.  Brace back on after ROM session if removed   - Pain management:             Multimodal   - ABL anemia/Hemodynamics             improved  No new issues   - Medical issues              Home meds   - DVT/PE prophylaxis:             eliquis x 30 days  - ID:              Perioperative antibiotics completed   - Metabolic Bone Disease:              Vitamin D insufficiency noted                         Supplement             Fractures suggestive of fragility fracture             Recommend bone density scan in the next 4 to 8 weeks   - Activity:             As above   - FEN/GI prophylaxis/Foley/Lines:             NSL             Reg diet             No foley, no purewick   - Impediments to fracture healing:             Vitamin D insufficiency             Low-energy fracture indicative of fragility fracture   - Dispo:             Therapies              home today   Follow up with ortho  in 10 days    Jari Pigg, PA-C [phone] (C) 09/24/2022, 9:11 AM  [facility] [place] [address] [phone] Jenetta [phone] (F)    After 5pm and on the weekends please log on to Amion, go to orthopaedics and the look under the [facility] Call for the provider(s) on call. You can also call our office at [phone] and then follow the prompts to be connected to the call team.  Patient ID: Erika Smith, female   DOB: Jan 30, 1956, 67 y.o.   MRN: [id number]

## 2022-09-28 NOTE — Discharge Summary (Signed)
Physician Discharge Summary  Patient ID: Erika Smith MRN: [id number] DOB/AGE: December 25, 1955 67 y.o.  Admit date: 09/19/2022 Discharge date: 09/28/2022  Admission Diagnoses:  * No surgery found * Closed fracture of left tibial plateau  Discharge Diagnoses:  * No surgery found * Principal Problem:   Closed fracture of left tibial plateau   Past Medical History:  Diagnosis Date   GERD (gastroesophageal reflux disease)    Hyperlipidemia    Prediabetes    Urinary incontinence     Surgeries:  on * No surgery found *   Consultants (if any): Treatment Team:  Erika Smith Team 9, MD [place], Erika Ochs, MD  Discharged Condition: Improved  Hospital Course: Erika Smith is an 67 y.o. female who was admitted 09/19/2022 with a diagnosis of comminuted fractures of the left tibial plateau and proximal fibular head.  Given the complexity of the patient's fracture she was transferred to [facility] for Dr. Myrene Galas the orthopedic trauma specialist to perform her definitive surgery on hospital day #2.  The patient's pain was well-controlled during her hospitalization and there were no signs of compartment syndrome.  She was given perioperative antibiotics:  Anti-infectives (From admission, onward)    None     .  She was given sequential compression devices, early ambulation, and lovenox for DVT prophylaxis.  She benefited maximally from the hospital stay and there were no complications.    Recent vital signs:  Vitals:   09/21/22 1123 09/22/22 0720  BP: (!) 120/53 121/63  Pulse: 77 79  Resp: 18   Temp: 98.5 F (36.9 C) 98.1 F (36.7 C)  SpO2: 95% 99%    Recent laboratory studies:  Lab Results  Component Value Date   HGB 12.0 09/24/2022   HGB 7.8 (L) 09/23/2022   HGB 9.8 (L) 09/22/2022   Lab Results  Component Value Date   WBC 6.9 09/24/2022   PLT 222 09/24/2022   Lab Results  Component Value Date   INR 1.0 09/19/2022   Lab  Results  Component Value Date   NA 137 09/21/2022   K 4.1 09/21/2022   CL 102 09/21/2022   CO2 25 09/21/2022   BUN 18 09/21/2022   CREATININE 0.69 09/21/2022   GLUCOSE 153 (H) 09/21/2022    Discharge Medications:   Allergies as of 09/21/2022       Reactions   Codeine    Dilaudid [hydromorphone] Nausea And Vomiting   Morphine    Percocet  [oxycodone-acetaminophen]    Tramadol Hcl         Medication List     ASK your doctor about these medications    atorvastatin 20 MG tablet Commonly known as: LIPITOR TAKE 1 TABLET BY MOUTH EVERY DAY   MULTIVITAMIN ADULT PO Take 1 tablet by mouth daily.   omeprazole 20 MG capsule Commonly known as: PRILOSEC Take 20 mg by mouth daily.   oxybutynin 5 MG 24 hr tablet Commonly known as: DITROPAN-XL TAKE 1 TABLET BY MOUTH EVERYDAY AT BEDTIME        Diagnostic Studies: DG Knee Left Port  Result Date: 09/21/2022 CLINICAL DATA:  Close bicondylar fracture of the left tibial plateau EXAM: PORTABLE LEFT KNEE - 1-2 VIEW COMPARISON:  CT 09/19/2022 FINDINGS: Lateral fixation plate and screws transfixing the proximal tibial fracture. Expected alignment. Separate fibular head and neck fracture. Small joint effusion seen about the knee. No additional fracture or dislocation. Preserved bone mineralization. Imaging was obtained to aid in treatment.  IMPRESSION: Acute Surgical changes of proximal tibia ORIF. Separate fibular fracture Electronically Signed   By: Erika Smith M.D.   On: 09/21/2022 17:44   DG Knee Complete 4 Views Left  Result Date: 09/21/2022 CLINICAL DATA:  ORIF tibial plateau fracture. EXAM: LEFT KNEE - COMPLETE 4+ VIEW fluoroscopic views COMPARISON:  Preop CT 09/19/2022 FINDINGS: Five fluoroscopic spot images submitted for review demonstrates placement of a lateral fixation plate and screws transfixing the proximal tibial fracture. Expected alignment. Separate fibular neck fracture. Imaging was obtained to aid in treatment. Please  correlate with real-time fluoroscopy of 33.8 seconds IMPRESSION: Acute surgical changes of proximal tibia ORIF Electronically Signed   By: Erika Smith M.D.   On: 09/21/2022 16:47   DG C-Arm 1-60 Min-No Report  Result Date: 09/21/2022 Fluoroscopy was utilized by the requesting physician.  No radiographic interpretation.   CT 3D RECON AT SCANNER  Result Date: 09/21/2022 CLINICAL DATA:  Left tibial plateau fracture. EXAM: 3-DIMENSIONAL CT IMAGE RENDERING ON ACQUISITION WORKSTATION TECHNIQUE: 3-dimensional CT images were rendered by post-processing of the original CT data on an acquisition workstation. The 3-dimensional CT images were interpreted and findings were reported in the accompanying complete CT report for this study. COMPARISON:  None Available. FINDINGS: See separate CT left knee report dated September 19, 2022. IMPRESSION: 1. Three-dimensional reconstructions of the left tibial plateau and fibular head fractures. Electronically Signed   By: Erika Smith M.D.   On: 09/21/2022 09:13   CT KNEE LEFT WO CONTRAST  Result Date: 09/19/2022 CLINICAL DATA:  Knee trauma.  Lateral tibial plateau fracture. EXAM: CT OF THE LEFT KNEE WITHOUT CONTRAST TECHNIQUE: Multidetector CT imaging of the left knee was performed according to the standard protocol. Multiplanar CT image reconstructions were also generated. RADIATION DOSE REDUCTION: This exam was performed according to the departmental dose-optimization program which includes automated exposure control, adjustment of the mA and/or kV according to patient size and/or use of iterative reconstruction technique. COMPARISON:  Left knee radiographs 09/19/2022 FINDINGS: Bones/Joint/Cartilage There is mildly decreased bone mineralization. There is a markedly comminuted acute fracture of the proximal lateral tibia with fracture line extending from the lateral tibial spine inferiorly and laterally to the lateral proximal metaphyseal and epiphyseal tibial cortex and  inferiorly and medially to the far medial proximal tibial metaphyseal cortex. There is up to approximately 5 mm diastasis of the proximal lateral tibial fracture line. 2 mm lateral cortical step-off at the far anterolateral aspect of the proximal tibia (coronal series 8, image 61) and up to 11 mm lateral cortical step-off at the mid AP dimension of the proximal tibia (coronal series 8, image 71). At the proximal medial tibial metaphysis, there is up to 3 mm medial cortical step-off of the proximal fracture component posteriorly (coronal series 8, image 79) and up to 7 mm lateral cortical step-off of the distal fracture component anteriorly (coronal series 8, image 58). There is a subtle nondisplaced fracture line also seen within the medial tibial spine (coronal images 73 through 80). No tibial plateau depression is visualized. There is a markedly comminuted fracture of the proximal fibula extending from the proximal cortex through the proximal fibular metaphysis. Up to 9 mm posterior cortical step-off posteriorly with mild posterior and medial angulation of the proximal posterior fracture fragment (coronal images 98 through 110 and sagittal image 100). Mild-to-moderate medial compartment of the knee joint space narrowing with mild peripheral degenerative osteophytes. Mild superior patellar degenerative osteophytosis. Ligaments Suboptimally assessed by CT. Muscles and Tendons Normal size and  density of the regional musculature. No gross tendon tear is seen. Soft tissues There is a moderate joint effusion with layering dense hematocrit. There is moderate soft tissue swelling of the anterior proximal calf at the level of the tibial fractures. IMPRESSION: 1. Markedly comminuted and mildly displaced fracture lines involving the predominantly proximal lateral tibia extending through the medial proximal tibial metaphyseal cortex. 2. Markedly comminuted and mildly displaced acute fracture of the proximal fibula. 3.  Moderate joint effusion with layering dense hematocrit. Electronically Signed   By: Neita Garnet M.D.   On: 09/19/2022 18:08   DG Tibia/Fibula Left Port  Result Date: 09/19/2022 CLINICAL DATA:  Injury. Fall. Dog ran into leg. Deformity noted at proximal tibia/fibula. EXAM: LEFT KNEE - COMPLETE 4+ VIEW; PORTABLE LEFT ANKLE - 2 VIEW; PORTABLE LEFT TIBIA AND FIBULA - 2 VIEW COMPARISON:  None Available. FINDINGS: Left knee: There is a comminuted fracture of the proximal fibula extending through the metaphysis with mild displacement of the comminuted components, and up to 6 mm lateral cortical step-off, 8 mm posterior cortical step-off, and mild anterior apex angulation of the fracture. There is a transverse to oblique, mildly comminuted fracture of the proximal tibia, extending from the proximal lateral tibial physis and metadiaphysis through the proximal medial tibial metaphysis. 3 mm medial cortical step-off and 6 mm posterior cortical step-off. Mild medial knee compartment joint space narrowing and peripheral osteophytosis. No knee joint effusion. -- Left tibia and fibula: Likely vascular phleboliths overlie the posterior calf. -- Left ankle: The ankle mortise is symmetric and intact. Minimal chronic enthesopathic change at the Achilles insertion on the calcaneus. Subtle lucency measuring only approximately 1 mm in length at the far medial aspect of the medial malleolus, suspicious for an acute nondisplaced fracture. IMPRESSION: 1. Comminuted, mildly displaced, and angulated fracture of the proximal fibula. 2. Mildly comminuted acute fracture of the proximal tibia, extending from the proximal lateral tibial physis and metadiaphysis through the proximal medial tibial metaphysis. 3. Probable tiny 1 mm linear nondisplaced acute fracture at the far medial aspect of the medial malleolus. Recommend clinical correlation for point tenderness. Electronically Signed   By: Neita Garnet M.D.   On: 09/19/2022 16:46   DG  Ankle Left Port  Result Date: 09/19/2022 CLINICAL DATA:  Injury. Fall. Dog ran into leg. Deformity noted at proximal tibia/fibula. EXAM: LEFT KNEE - COMPLETE 4+ VIEW; PORTABLE LEFT ANKLE - 2 VIEW; PORTABLE LEFT TIBIA AND FIBULA - 2 VIEW COMPARISON:  None Available. FINDINGS: Left knee: There is a comminuted fracture of the proximal fibula extending through the metaphysis with mild displacement of the comminuted components, and up to 6 mm lateral cortical step-off, 8 mm posterior cortical step-off, and mild anterior apex angulation of the fracture. There is a transverse to oblique, mildly comminuted fracture of the proximal tibia, extending from the proximal lateral tibial physis and metadiaphysis through the proximal medial tibial metaphysis. 3 mm medial cortical step-off and 6 mm posterior cortical step-off. Mild medial knee compartment joint space narrowing and peripheral osteophytosis. No knee joint effusion. -- Left tibia and fibula: Likely vascular phleboliths overlie the posterior calf. -- Left ankle: The ankle mortise is symmetric and intact. Minimal chronic enthesopathic change at the Achilles insertion on the calcaneus. Subtle lucency measuring only approximately 1 mm in length at the far medial aspect of the medial malleolus, suspicious for an acute nondisplaced fracture. IMPRESSION: 1. Comminuted, mildly displaced, and angulated fracture of the proximal fibula. 2. Mildly comminuted acute fracture of the proximal  tibia, extending from the proximal lateral tibial physis and metadiaphysis through the proximal medial tibial metaphysis. 3. Probable tiny 1 mm linear nondisplaced acute fracture at the far medial aspect of the medial malleolus. Recommend clinical correlation for point tenderness. Electronically Signed   By: Neita Garnet M.D.   On: 09/19/2022 16:46   DG Knee Complete 4 Views Left  Result Date: 09/19/2022 CLINICAL DATA:  Injury. Fall. Dog ran into leg. Deformity noted at proximal  tibia/fibula. EXAM: LEFT KNEE - COMPLETE 4+ VIEW; PORTABLE LEFT ANKLE - 2 VIEW; PORTABLE LEFT TIBIA AND FIBULA - 2 VIEW COMPARISON:  None Available. FINDINGS: Left knee: There is a comminuted fracture of the proximal fibula extending through the metaphysis with mild displacement of the comminuted components, and up to 6 mm lateral cortical step-off, 8 mm posterior cortical step-off, and mild anterior apex angulation of the fracture. There is a transverse to oblique, mildly comminuted fracture of the proximal tibia, extending from the proximal lateral tibial physis and metadiaphysis through the proximal medial tibial metaphysis. 3 mm medial cortical step-off and 6 mm posterior cortical step-off. Mild medial knee compartment joint space narrowing and peripheral osteophytosis. No knee joint effusion. -- Left tibia and fibula: Likely vascular phleboliths overlie the posterior calf. -- Left ankle: The ankle mortise is symmetric and intact. Minimal chronic enthesopathic change at the Achilles insertion on the calcaneus. Subtle lucency measuring only approximately 1 mm in length at the far medial aspect of the medial malleolus, suspicious for an acute nondisplaced fracture. IMPRESSION: 1. Comminuted, mildly displaced, and angulated fracture of the proximal fibula. 2. Mildly comminuted acute fracture of the proximal tibia, extending from the proximal lateral tibial physis and metadiaphysis through the proximal medial tibial metaphysis. 3. Probable tiny 1 mm linear nondisplaced acute fracture at the far medial aspect of the medial malleolus. Recommend clinical correlation for point tenderness. Electronically Signed   By: Neita Garnet M.D.   On: 09/19/2022 16:46    Disposition:  There are no questions and answers to display.             Signed: Juanell Fairly ,MD 09/28/2022, 6:21 PM

## 2022-11-07 ENCOUNTER — Other Ambulatory Visit: Payer: Self-pay | Admitting: Family Medicine

## 2022-11-07 DIAGNOSIS — E785 Hyperlipidemia, unspecified: Secondary | ICD-10-CM

## 2023-01-26 ENCOUNTER — Encounter (HOSPITAL_COMMUNITY): Payer: Self-pay | Admitting: Orthopedic Surgery

## 2023-01-26 ENCOUNTER — Other Ambulatory Visit: Payer: Self-pay

## 2023-01-26 NOTE — Progress Notes (Signed)
PCP - [facility] [employer] - denies  PPM/ICD - denies  EKG - 09/19/22 Stress Test - 12/12/18 - negative per patient Cardiac Cath - 11/2007  CPAP - n/a  Fasting Blood Sugar - n/a  Blood Thinner Instructions: n/a Patient was instructed: As of today, STOP taking any Aspirin (unless otherwise instructed by your surgeon) Aleve, Naproxen, Ibuprofen, Motrin, Advil, Goody's, BC's, all herbal medications, fish oil, and all vitamins.  ERAS Protcol - yes, until 05:00 o'clock  COVID TEST- n/a  Anesthesia review: no  Patient verbally denies any shortness of breath, fever, cough and chest pain during phone call   -------------  SDW INSTRUCTIONS given:  Your procedure is scheduled on Thursday, May 9th, 2024.  Report to [facility] Main Entrance "A" at 05:30 A.M., and check in at the Admitting office.  Call this number if you have problems the morning of surgery:  [phone]   Remember:  Do not eat after midnight the night before your surgery  You may drink clear liquids until 05:00 the morning of your surgery.   Clear liquids allowed are: Water, Non-Citrus Juices (without pulp), Carbonated Beverages, Clear Tea, Black Coffee Only, and Gatorade    Take these medicines the morning of surgery with A SIP OF WATER: Lipitor, Prilosec PRN: Tylenol  The day of surgery:                     Do not wear jewelry, make up, or nail polish            Do not wear lotions, powders, perfumes, or deodorant.            Do not shave 48 hours prior to surgery.              Do not bring valuables to the hospital.            [facility] is not responsible for any belongings or valuables.  Do NOT Smoke (Tobacco/Vaping) 24 hours prior to your procedure If you use a CPAP at night, you may bring all equipment for your overnight stay.   Contacts, glasses, dentures or bridgework may not be worn into surgery.      For patients admitted to the hospital, discharge time  will be determined by your treatment team.   Patients discharged the day of surgery will not be allowed to drive home, and someone needs to stay with them for 24 hours.    Special instructions:   [facility]- Preparing For Surgery  Before surgery, you can play an important role. Because skin is not sterile, your skin needs to be as free of germs as possible. You can reduce the number of germs on your skin by washing with CHG (chlorahexidine gluconate) Soap before surgery.  CHG is an antiseptic cleaner which kills germs and bonds with the skin to continue killing germs even after washing.    Oral Hygiene is also important to reduce your risk of infection.  Remember - BRUSH YOUR TEETH THE MORNING OF SURGERY WITH YOUR REGULAR TOOTHPASTE  Please do not use if you have an allergy to CHG or antibacterial soaps. If your skin becomes reddened/irritated stop using the CHG.  Do not shave (including legs and underarms) for at least 48 hours prior to first CHG shower. It is OK to shave your face.  Please follow these instructions carefully.   Shower the NIGHT BEFORE SURGERY and the MORNING OF SURGERY with DIAL Soap.  Pat yourself dry with a CLEAN TOWEL.  Wear CLEAN PAJAMAS to bed the night before surgery  Place CLEAN SHEETS on your bed the night of your first shower and DO NOT SLEEP WITH PETS.   Day of Surgery: Please shower morning of surgery  Wear Clean/Comfortable clothing the morning of surgery Do not apply any deodorants/lotions.   Remember to brush your teeth WITH YOUR REGULAR TOOTHPASTE.   Questions were answered. Patient verbalized understanding of instructions.

## 2023-01-26 NOTE — H&P (Signed)
Orthopaedic Trauma Service (OTS) Consult   Patient ID: Erika Smith MRN: [id number] DOB/AGE: 1956-05-16 67 y.o.   HPI: Erika Smith is an 67 y.o. female s/p ORIF left bicondylar tibial plateau fracture September 21, 2022. Patient has done quite well up until about a month ago when her pain got significantly worse.  She has developed severe mechanical symptoms that is reproducible with a pop.  She had been previously ambulatory without any assistive devices however has gone back to a cane and fear that she may fall.  She was seen and evaluated in the office on 01/10/2023 where physical exam is consistent with possible meniscal tear.  We did discuss the possibility of MRI prior to arthroscopy however the plateau hardware will likely cause significant artifact obscuring fine detail.  We discussed risks and benefits of surgery with the patient she would like to proceed with removal of hardware and diagnostic arthroscopy.  Past Medical History:  Diagnosis Date   GERD (gastroesophageal reflux disease)    Hyperlipidemia    PONV (postoperative nausea and vomiting)    Pre-diabetes    Prediabetes    Urinary incontinence     Past Surgical History:  Procedure Laterality Date   ABDOMINAL HYSTERECTOMY  1982   BSO   APPENDECTOMY  1982   CARDIAC CATHETERIZATION     11/2007   CHOLECYSTECTOMY  2010   MR Brain, Brain Stem  05/09/2007    Normal MRI.  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 Bluford Kaufmann. LA Grade A reflux esophagitis.    Family History  Problem Relation Age of Onset   Stroke Mother    Diabetes Mother    Diabetes Mellitus II Father    Stroke Father    Heart disease Father     Congestive Heart Failure Father    Allergies Sister    Asthma Sister    Diabetes Brother    Heart disease Brother    Leukemia Other    Heart disease Sister        Had triple bypass   Cancer - Other Sister        Cancer    Social History:  reports that she quit smoking about 16 years ago. Her smoking use included cigarettes. She started smoking about 48 years ago. She has a 33.00 pack-year smoking history. She has never used smokeless tobacco. She reports that she does not drink alcohol and does not use drugs.  Allergies:  Allergies  Allergen Reactions   Codeine Nausea And Vomiting   Dilaudid [Hydromorphone] Nausea And Vomiting   Morphine Nausea And Vomiting   Percocet  [Oxycodone-Acetaminophen] Nausea And Vomiting   Tramadol Hcl Nausea And Vomiting    Medications:  Current Outpatient Medications  Medication Instructions   Acetaminophen Extra Strength 500 mg, Oral, Every 12 hours   atorvastatin (LIPITOR) 20 MG tablet TAKE 1 TABLET BY MOUTH EVERY DAY   bisacodyl 5 mg, Oral, Daily PRN   calcium carbonate (OSCAL) 1,500 mg, Oral, 2 times daily   docusate sodium (COLACE) 100 mg, Oral, 2 times daily   Eliquis 2.5 mg, Oral, 2 times daily   omeprazole (PRILOSEC) 20 mg, Oral, Daily  oxybutynin (DITROPAN-XL) 5 MG 24 hr tablet TAKE 1 TABLET BY MOUTH EVERYDAY AT BEDTIME   SM Vitamin D3 5,000 Units, Oral, Daily   vitamin C 1,000 mg, Oral, Daily     No results found for this or any previous visit (from the past 48 hour(s)).  No results found.  Intake/Output    None      Review of Systems  Constitutional:  Negative for chills and fever.  Respiratory:  Negative for shortness of breath.   Cardiovascular:  Negative for chest pain and palpitations.  Gastrointestinal:  Negative for nausea and vomiting.  Musculoskeletal:        Left knee pain  Neurological:  Negative for tingling and sensory change.   There were no vitals taken for this visit. Physical Exam Constitutional:       General: She is not in acute distress.    Appearance: Normal appearance.  HENT:     Head: Normocephalic and atraumatic.     Mouth/Throat:     Mouth: Mucous membranes are moist.  Eyes:     Extraocular Movements: Extraocular movements intact.  Cardiovascular:     Rate and Rhythm: Normal rate and regular rhythm.     Pulses: Normal pulses.  Pulmonary:     Effort: Pulmonary effort is normal.     Breath sounds: Normal breath sounds.  Musculoskeletal:     Comments: Left lower extremity Significant lateral joint line tenderness with positive Murray's test laterally is noted No significant tenderness over hardware and no tenderness over fracture site Minimal swelling distally Knee range of motion is 0-115+ degrees DPN, SPN, TN sensory functions are grossly intact EHL, FHL, lesser toe motor function intact Ankle flexion and extension intact.  Knee flexion extension intact. No DCT Gait with some start up antalgia but then dissipates + DP pulse Extremity is warm Good perfusion distally  Skin:    General: Skin is warm.     Capillary Refill: Capillary refill takes less than 2 seconds.  Neurological:     General: No focal deficit present.     Mental Status: She is alert and oriented to person, place, and time.  Psychiatric:        Mood and Affect: Mood normal.        Behavior: Behavior normal.        Thought Content: Thought content normal.        Judgment: Judgment normal.     Assessment/Plan:  67 year old female with persistent left knee mechanical symptoms status post ORIF left bicondylar tibial plateau fracture  -Internal derangement left knee with mechanical symptoms status post ORIF left bicondylar tibial plateau fracture January 2024  OR for removal of hardware and left knee arthroscopy  Anticipate weight-bear as tolerated postoperatively  No motion restrictions postoperatively  Anticipate outpatient procedure  Risks and benefits reviewed with the patient and she  wishes to proceed  - Pain management:  Multimodal    - DVT/PE prophylaxis:  Aspirin 325 mg daily for 2 weeks - ID:   Perioperative antibiotics - Dispo:  OR for procedures noted above     Mearl Latin, PA-C [phone] (C) 01/26/2023, 3:33 PM  [facility] [address] [place] [address] [phone] Val [phone] (F)    After 5pm and on the weekends please log on to Amion, go to orthopaedics and the look under the [facility] Call for the provider(s) on call. You can also call our office at [phone] and then follow the prompts to  be connected to the call team.

## 2023-01-26 NOTE — Anesthesia Preprocedure Evaluation (Addendum)
Anesthesia Evaluation  Patient identified by MRN, date of birth, ID band Patient awake    Reviewed: Allergy & Precautions, NPO status , Patient's Chart, lab work & pertinent test results  History of Anesthesia Complications (+) PONV and history of anesthetic complications  Airway Mallampati: II  TM Distance: >3 FB Neck ROM: Full    Dental no notable dental hx.    Pulmonary COPD, former smoker   Pulmonary exam normal        Cardiovascular + CAD  Normal cardiovascular exam     Neuro/Psych negative neurological ROS  negative psych ROS   GI/Hepatic Neg liver ROS,GERD  Medicated and Controlled,,  Endo/Other  negative endocrine ROS    Renal/GU negative Renal ROS  negative genitourinary   Musculoskeletal   Abdominal   Peds  Hematology negative hematology ROS (+)   Anesthesia Other Findings SYMPTOMATIC HARDWARE LEFT TIBIA, INTERNAL DERANGEMENT LEFT KNEE  Reproductive/Obstetrics                              Anesthesia Physical Anesthesia Plan  ASA: 2  Anesthesia Plan: General   Post-op Pain Management: Tylenol PO (pre-op)*   Induction: Intravenous  PONV Risk Score and Plan: 4 or greater and Midazolam, Treatment may vary due to age or medical condition, Dexamethasone, Ondansetron, Propofol infusion and TIVA  Airway Management Planned: LMA  Additional Equipment: None  Intra-op Plan:   Post-operative Plan: Extubation in OR  Informed Consent: I have reviewed the patients History and Physical, chart, labs and discussed the procedure including the risks, benefits and alternatives for the proposed anesthesia with the patient or authorized representative who has indicated his/her understanding and acceptance.     Dental advisory given  Plan Discussed with: CRNA  Anesthesia Plan Comments:         Anesthesia Quick Evaluation

## 2023-01-27 ENCOUNTER — Ambulatory Visit (HOSPITAL_COMMUNITY): Payer: Medicare HMO

## 2023-01-27 ENCOUNTER — Other Ambulatory Visit: Payer: Self-pay

## 2023-01-27 ENCOUNTER — Ambulatory Visit (HOSPITAL_COMMUNITY): Payer: Medicare HMO | Admitting: Anesthesiology

## 2023-01-27 ENCOUNTER — Ambulatory Visit (HOSPITAL_COMMUNITY)
Admission: RE | Admit: 2023-01-27 | Discharge: 2023-01-27 | Disposition: A | Discharge status: Home / Self Care | Payer: Medicare HMO | Source: Ambulatory Visit | Attending: Orthopedic Surgery | Admitting: Orthopedic Surgery

## 2023-01-27 ENCOUNTER — Encounter (HOSPITAL_COMMUNITY): Payer: Self-pay | Admitting: Orthopedic Surgery

## 2023-01-27 ENCOUNTER — Encounter (HOSPITAL_COMMUNITY)
Admission: RE | Disposition: A | Discharge status: Home / Self Care | Payer: Self-pay | Source: Ambulatory Visit | Attending: Orthopedic Surgery

## 2023-01-27 ENCOUNTER — Ambulatory Visit (HOSPITAL_BASED_OUTPATIENT_CLINIC_OR_DEPARTMENT_OTHER): Payer: Medicare HMO | Admitting: Anesthesiology

## 2023-01-27 DIAGNOSIS — S83242A Other tear of medial meniscus, current injury, left knee, initial encounter: Secondary | ICD-10-CM | POA: Insufficient documentation

## 2023-01-27 DIAGNOSIS — I2510 Atherosclerotic heart disease of native coronary artery without angina pectoris: Secondary | ICD-10-CM | POA: Insufficient documentation

## 2023-01-27 DIAGNOSIS — X58XXXA Exposure to other specified factors, initial encounter: Secondary | ICD-10-CM | POA: Insufficient documentation

## 2023-01-27 DIAGNOSIS — M2392 Unspecified internal derangement of left knee: Secondary | ICD-10-CM | POA: Diagnosis not present

## 2023-01-27 DIAGNOSIS — E785 Hyperlipidemia, unspecified: Secondary | ICD-10-CM | POA: Diagnosis not present

## 2023-01-27 DIAGNOSIS — R7303 Prediabetes: Secondary | ICD-10-CM | POA: Insufficient documentation

## 2023-01-27 DIAGNOSIS — J449 Chronic obstructive pulmonary disease, unspecified: Secondary | ICD-10-CM

## 2023-01-27 DIAGNOSIS — T8484XA Pain due to internal orthopedic prosthetic devices, implants and grafts, initial encounter: Secondary | ICD-10-CM

## 2023-01-27 DIAGNOSIS — K219 Gastro-esophageal reflux disease without esophagitis: Secondary | ICD-10-CM | POA: Insufficient documentation

## 2023-01-27 DIAGNOSIS — Z87891 Personal history of nicotine dependence: Secondary | ICD-10-CM

## 2023-01-27 DIAGNOSIS — S83282A Other tear of lateral meniscus, current injury, left knee, initial encounter: Secondary | ICD-10-CM | POA: Diagnosis not present

## 2023-01-27 DIAGNOSIS — M65862 Other synovitis and tenosynovitis, left lower leg: Secondary | ICD-10-CM | POA: Insufficient documentation

## 2023-01-27 DIAGNOSIS — Z01818 Encounter for other preprocedural examination: Secondary | ICD-10-CM

## 2023-01-27 HISTORY — DX: Other specified postprocedural states: Z98.890

## 2023-01-27 HISTORY — PX: HARDWARE REMOVAL: SHX979

## 2023-01-27 HISTORY — PX: KNEE ARTHROSCOPY: SHX127

## 2023-01-27 HISTORY — DX: Prediabetes: R73.03

## 2023-01-27 LAB — CBC
HCT: 38.7 % (ref 36.0–46.0)
Hemoglobin: 13.3 g/dL (ref 12.0–15.0)
MCH: 33.4 pg (ref 26.0–34.0)
MCHC: 34.4 g/dL (ref 30.0–36.0)
MCV: 97.2 fL (ref 80.0–100.0)
Platelets: 278 10*3/uL (ref 150–400)
RBC: 3.98 MIL/uL (ref 3.87–5.11)
RDW: 11.9 % (ref 11.5–15.5)
WBC: 6.1 10*3/uL (ref 4.0–10.5)
nRBC: 0 % (ref 0.0–0.2)

## 2023-01-27 LAB — GLUCOSE, CAPILLARY: Glucose-Capillary: 133 mg/dL — ABNORMAL HIGH (ref 70–99)

## 2023-01-27 SURGERY — REMOVAL, HARDWARE
Anesthesia: General | Site: Leg Lower | Laterality: Left

## 2023-01-27 MED ORDER — FENTANYL CITRATE (PF) 100 MCG/2ML IJ SOLN: 25.0000 ug | INTRAMUSCULAR | Status: DC | PRN

## 2023-01-27 MED ORDER — MIDAZOLAM HCL 5 MG/5ML IJ SOLN
INTRAMUSCULAR | Status: DC | PRN
  Administered 2023-01-27: 2 mg via INTRAVENOUS

## 2023-01-27 MED ORDER — KETOROLAC TROMETHAMINE 10 MG PO TABS: 10.0000 mg | ORAL_TABLET | Freq: Four times a day (QID) | ORAL | 0 refills | Status: AC | PRN

## 2023-01-27 MED ORDER — FENTANYL CITRATE (PF) 250 MCG/5ML IJ SOLN
INTRAMUSCULAR | Status: DC | PRN
  Administered 2023-01-27: 25 ug via INTRAVENOUS

## 2023-01-27 MED ORDER — LACTATED RINGERS IV SOLN: INTRAVENOUS | Status: DC

## 2023-01-27 MED ORDER — PROPOFOL 1000 MG/100ML IV EMUL
INTRAVENOUS | Status: AC
  Filled 2023-01-27: qty 100

## 2023-01-27 MED ORDER — PHENYLEPHRINE 80 MCG/ML (10ML) SYRINGE FOR IV PUSH (FOR BLOOD PRESSURE SUPPORT)
PREFILLED_SYRINGE | INTRAVENOUS | Status: DC | PRN
  Administered 2023-01-27 (×2): 80 ug via INTRAVENOUS

## 2023-01-27 MED ORDER — MIDAZOLAM HCL 2 MG/2ML IJ SOLN
INTRAMUSCULAR | Status: AC
  Filled 2023-01-27: qty 2

## 2023-01-27 MED ORDER — LABETALOL HCL 5 MG/ML IV SOLN
INTRAVENOUS | Status: AC
  Filled 2023-01-27: qty 4

## 2023-01-27 MED ORDER — PROPOFOL 10 MG/ML IV BOLUS
INTRAVENOUS | Status: AC
  Filled 2023-01-27: qty 20

## 2023-01-27 MED ORDER — ACETAMINOPHEN 500 MG PO TABS
1000.0000 mg | ORAL_TABLET | Freq: Once | ORAL | Status: AC
  Administered 2023-01-27: 1000 mg via ORAL

## 2023-01-27 MED ORDER — 0.9 % SODIUM CHLORIDE (POUR BTL) OPTIME
TOPICAL | Status: DC | PRN
  Administered 2023-01-27: 1000 mL

## 2023-01-27 MED ORDER — PROPOFOL 10 MG/ML IV BOLUS
INTRAVENOUS | Status: DC | PRN
  Administered 2023-01-27: 150 mg via INTRAVENOUS

## 2023-01-27 MED ORDER — LIDOCAINE 2% (20 MG/ML) 5 ML SYRINGE
INTRAMUSCULAR | Status: DC | PRN
  Administered 2023-01-27: 40 mg via INTRAVENOUS

## 2023-01-27 MED ORDER — DEXAMETHASONE SODIUM PHOSPHATE 10 MG/ML IJ SOLN
INTRAMUSCULAR | Status: AC
  Filled 2023-01-27: qty 1

## 2023-01-27 MED ORDER — ACETAMINOPHEN 500 MG PO TABS
1000.0000 mg | ORAL_TABLET | Freq: Once | ORAL | Status: AC
  Administered 2023-01-27: 1000 mg via ORAL
  Filled 2023-01-27: qty 2

## 2023-01-27 MED ORDER — CHLORHEXIDINE GLUCONATE 0.12 % MT SOLN
OROMUCOSAL | Status: AC
  Administered 2023-01-27: 15 mL via OROMUCOSAL
  Filled 2023-01-27: qty 15

## 2023-01-27 MED ORDER — HYDROCODONE-ACETAMINOPHEN 5-325 MG PO TABS: 1.0000 | ORAL_TABLET | Freq: Three times a day (TID) | ORAL | 0 refills | Status: AC | PRN

## 2023-01-27 MED ORDER — AMISULPRIDE (ANTIEMETIC) 5 MG/2ML IV SOLN: 10.0000 mg | Freq: Once | INTRAVENOUS | Status: DC | PRN

## 2023-01-27 MED ORDER — DOCUSATE SODIUM 100 MG PO CAPS: 100.0000 mg | ORAL_CAPSULE | Freq: Every day | ORAL | 0 refills | Status: AC

## 2023-01-27 MED ORDER — PROPOFOL 500 MG/50ML IV EMUL
INTRAVENOUS | Status: DC | PRN
  Administered 2023-01-27: 150 ug/kg/min via INTRAVENOUS

## 2023-01-27 MED ORDER — CEFAZOLIN SODIUM-DEXTROSE 2-4 GM/100ML-% IV SOLN
2.0000 g | INTRAVENOUS | Status: AC
  Administered 2023-01-27: 2 g via INTRAVENOUS
  Filled 2023-01-27: qty 100

## 2023-01-27 MED ORDER — METHOCARBAMOL 500 MG PO TABS: 500.0000 mg | ORAL_TABLET | Freq: Three times a day (TID) | ORAL | 0 refills | Status: AC | PRN

## 2023-01-27 MED ORDER — PHENYLEPHRINE HCL-NACL 20-0.9 MG/250ML-% IV SOLN
INTRAVENOUS | Status: DC | PRN
  Administered 2023-01-27: 40 ug/min via INTRAVENOUS

## 2023-01-27 MED ORDER — SODIUM CHLORIDE 0.9 % IR SOLN
Status: DC | PRN
  Administered 2023-01-27 (×2): 3000 mL

## 2023-01-27 MED ORDER — LIDOCAINE 2% (20 MG/ML) 5 ML SYRINGE
INTRAMUSCULAR | Status: AC
  Filled 2023-01-27: qty 5

## 2023-01-27 MED ORDER — ONDANSETRON HCL 4 MG/2ML IJ SOLN
INTRAMUSCULAR | Status: DC | PRN
  Administered 2023-01-27: 4 mg via INTRAVENOUS

## 2023-01-27 MED ORDER — BUPIVACAINE-EPINEPHRINE (PF) 0.25% -1:200000 IJ SOLN
INTRAMUSCULAR | Status: AC
  Filled 2023-01-27: qty 30

## 2023-01-27 MED ORDER — DEXAMETHASONE SODIUM PHOSPHATE 10 MG/ML IJ SOLN
INTRAMUSCULAR | Status: DC | PRN
  Administered 2023-01-27: 10 mg via INTRAVENOUS

## 2023-01-27 MED ORDER — MORPHINE SULFATE (PF) 4 MG/ML IV SOLN
INTRAVENOUS | Status: AC
  Filled 2023-01-27: qty 1

## 2023-01-27 MED ORDER — FENTANYL CITRATE (PF) 250 MCG/5ML IJ SOLN
INTRAMUSCULAR | Status: AC
  Filled 2023-01-27: qty 5

## 2023-01-27 MED ORDER — PHENYLEPHRINE 80 MCG/ML (10ML) SYRINGE FOR IV PUSH (FOR BLOOD PRESSURE SUPPORT)
PREFILLED_SYRINGE | INTRAVENOUS | Status: AC
  Filled 2023-01-27: qty 10

## 2023-01-27 MED ORDER — LABETALOL HCL 5 MG/ML IV SOLN
5.0000 mg | Freq: Once | INTRAVENOUS | Status: AC
  Administered 2023-01-27: 5 mg via INTRAVENOUS

## 2023-01-27 MED ORDER — ONDANSETRON HCL 4 MG/2ML IJ SOLN
INTRAMUSCULAR | Status: AC
  Filled 2023-01-27: qty 2

## 2023-01-27 MED ORDER — BUPIVACAINE-EPINEPHRINE 0.25% -1:200000 IJ SOLN
INTRAMUSCULAR | Status: DC | PRN
  Administered 2023-01-27: 20 mL

## 2023-01-27 MED ORDER — ACETAMINOPHEN 500 MG PO TABS
ORAL_TABLET | ORAL | Status: AC
  Filled 2023-01-27: qty 2

## 2023-01-27 MED ORDER — CHLORHEXIDINE GLUCONATE 0.12 % MT SOLN: 15.0000 mL | OROMUCOSAL | Status: AC

## 2023-01-27 SURGICAL SUPPLY — 78 items
BAG COUNTER SPONGE SURGICOUNT (BAG) ×2 IMPLANT
BAG SPNG CNTER NS LX DISP (BAG) ×2
BANDAGE ESMARK 6X9 LF (GAUZE/BANDAGES/DRESSINGS) ×2 IMPLANT
BLADE CUDA 5.5 (BLADE) IMPLANT
BLADE EXCALIBUR 4.0X13 (MISCELLANEOUS) ×2 IMPLANT
BLADE SURG 11 STRL SS (BLADE) ×2 IMPLANT
BNDG CMPR 5X6 CHSV STRCH STRL (GAUZE/BANDAGES/DRESSINGS) ×2
BNDG CMPR 9X6 STRL LF SNTH (GAUZE/BANDAGES/DRESSINGS)
BNDG CMPR MED 10X6 ELC LF (GAUZE/BANDAGES/DRESSINGS) ×2
BNDG COHESIVE 6X5 TAN ST LF (GAUZE/BANDAGES/DRESSINGS) ×2 IMPLANT
BNDG ELASTIC 4X5.8 VLCR STR LF (GAUZE/BANDAGES/DRESSINGS) ×2 IMPLANT
BNDG ELASTIC 6X10 VLCR STRL LF (GAUZE/BANDAGES/DRESSINGS) IMPLANT
BNDG ELASTIC 6X5.8 VLCR STR LF (GAUZE/BANDAGES/DRESSINGS) ×4 IMPLANT
BNDG ESMARK 6X9 LF (GAUZE/BANDAGES/DRESSINGS)
BNDG GAUZE DERMACEA FLUFF 4 (GAUZE/BANDAGES/DRESSINGS) ×4 IMPLANT
BNDG GZE DERMACEA 4 6PLY (GAUZE/BANDAGES/DRESSINGS) ×2
BRUSH SCRUB EZ PLAIN DRY (MISCELLANEOUS) ×4 IMPLANT
COVER SURGICAL LIGHT HANDLE (MISCELLANEOUS) ×4 IMPLANT
CUFF TOURN SGL QUICK 18X4 (TOURNIQUET CUFF) IMPLANT
CUFF TOURN SGL QUICK 24 (TOURNIQUET CUFF)
CUFF TOURN SGL QUICK 34 (TOURNIQUET CUFF)
CUFF TOURN SGL QUICK 42 (TOURNIQUET CUFF) IMPLANT
CUFF TRNQT CYL 24X4X16.5-23 (TOURNIQUET CUFF) IMPLANT
CUFF TRNQT CYL 34X4.125X (TOURNIQUET CUFF) IMPLANT
DRAPE ARTHROSCOPY W/POUCH 114 (DRAPES) ×2 IMPLANT
DRAPE C-ARM 42X72 X-RAY (DRAPES) IMPLANT
DRAPE C-ARMOR (DRAPES) ×2 IMPLANT
DRAPE U-SHAPE 47X51 STRL (DRAPES) ×2 IMPLANT
DRSG ADAPTIC 3X8 NADH LF (GAUZE/BANDAGES/DRESSINGS) ×2 IMPLANT
DRSG EMULSION OIL 3X3 NADH (GAUZE/BANDAGES/DRESSINGS) ×2 IMPLANT
DRSG MEPITEL 3X4 ME34 (GAUZE/BANDAGES/DRESSINGS) IMPLANT
ELECT REM PT RETURN 9FT ADLT (ELECTROSURGICAL) ×2
ELECTRODE REM PT RTRN 9FT ADLT (ELECTROSURGICAL) ×2 IMPLANT
GAUZE 4X4 16PLY ~~LOC~~+RFID DBL (SPONGE) ×2 IMPLANT
GAUZE PAD ABD 8X10 STRL (GAUZE/BANDAGES/DRESSINGS) ×4 IMPLANT
GAUZE SPONGE 4X4 12PLY STRL (GAUZE/BANDAGES/DRESSINGS) ×2 IMPLANT
GLOVE BIO SURGEON STRL SZ7.5 (GLOVE) ×2 IMPLANT
GLOVE BIO SURGEON STRL SZ8 (GLOVE) ×2 IMPLANT
GLOVE BIOGEL PI IND STRL 7.5 (GLOVE) ×2 IMPLANT
GLOVE BIOGEL PI IND STRL 8 (GLOVE) ×2 IMPLANT
GLOVE SURG ORTHO LTX SZ7.5 (GLOVE) ×4 IMPLANT
GOWN STRL REUS W/ TWL LRG LVL3 (GOWN DISPOSABLE) ×4 IMPLANT
GOWN STRL REUS W/ TWL XL LVL3 (GOWN DISPOSABLE) ×2 IMPLANT
GOWN STRL REUS W/TWL LRG LVL3 (GOWN DISPOSABLE) ×2
GOWN STRL REUS W/TWL XL LVL3 (GOWN DISPOSABLE) ×2
KIT BASIN OR (CUSTOM PROCEDURE TRAY) ×2 IMPLANT
KIT TURNOVER KIT B (KITS) ×2 IMPLANT
MANIFOLD NEPTUNE II (INSTRUMENTS) ×2 IMPLANT
NDL 22X1.5 STRL (OR ONLY) (MISCELLANEOUS) IMPLANT
NEEDLE 22X1.5 STRL (OR ONLY) (MISCELLANEOUS) ×2 IMPLANT
NS IRRIG 1000ML POUR BTL (IV SOLUTION) ×2 IMPLANT
PACK ARTHROSCOPY DSU (CUSTOM PROCEDURE TRAY) ×2 IMPLANT
PACK ORTHO EXTREMITY (CUSTOM PROCEDURE TRAY) ×2 IMPLANT
PAD ARMBOARD 7.5X6 YLW CONV (MISCELLANEOUS) ×4 IMPLANT
PADDING CAST COTTON 6X4 STRL (CAST SUPPLIES) ×6 IMPLANT
PORT APPOLLO RF 90DEGREE MULTI (SURGICAL WAND) IMPLANT
PROBE APOLLO 90XL (SURGICAL WAND) IMPLANT
SPONGE T-LAP 18X18 ~~LOC~~+RFID (SPONGE) ×2 IMPLANT
STAPLER VISISTAT 35W (STAPLE) IMPLANT
STOCKINETTE IMPERVIOUS LG (DRAPES) ×2 IMPLANT
STRIP CLOSURE SKIN 1/2X4 (GAUZE/BANDAGES/DRESSINGS) IMPLANT
SUCTION FRAZIER HANDLE 10FR (MISCELLANEOUS) ×2
SUCTION TUBE FRAZIER 10FR DISP (MISCELLANEOUS) IMPLANT
SUT ETHILON 2 0 FS 18 (SUTURE) IMPLANT
SUT ETHILON 4 0 PS 2 18 (SUTURE) ×2 IMPLANT
SUT PDS AB 2-0 CT1 27 (SUTURE) IMPLANT
SUT VIC AB 0 CT1 27 (SUTURE) ×2
SUT VIC AB 0 CT1 27XBRD ANBCTR (SUTURE) IMPLANT
SUT VIC AB 2-0 CT1 27 (SUTURE) ×2
SUT VIC AB 2-0 CT1 TAPERPNT 27 (SUTURE) IMPLANT
SYR CONTROL 10ML LL (SYRINGE) IMPLANT
TOWEL GREEN STERILE (TOWEL DISPOSABLE) ×4 IMPLANT
TOWEL GREEN STERILE FF (TOWEL DISPOSABLE) ×4 IMPLANT
TUBE CONNECTING 12X1/4 (SUCTIONS) ×2 IMPLANT
TUBING ARTHROSCOPY IRRIG 16FT (MISCELLANEOUS) ×2 IMPLANT
UNDERPAD 30X36 HEAVY ABSORB (UNDERPADS AND DIAPERS) ×2 IMPLANT
WATER STERILE IRR 1000ML POUR (IV SOLUTION) ×4 IMPLANT
YANKAUER SUCT BULB TIP NO VENT (SUCTIONS) ×2 IMPLANT

## 2023-01-27 NOTE — Transfer of Care (Signed)
Immediate Anesthesia Transfer of Care Note  Patient: Erika Smith  Procedure(s) Performed: HARDWARE REMOVAL (Left: Leg Lower) ARTHROSCOPY KNEE (Left: Knee)  Patient Location: PACU  Anesthesia Type:General  Level of Consciousness: awake  Airway & Oxygen Therapy: Patient Spontanous Breathing  Post-op Assessment: Report given to RN and Post -op Vital signs reviewed and stable  Post vital signs: Reviewed and stable  Last Vitals:  Vitals Value Taken Time  BP 118/98 01/27/23 1115  Temp 36.4 C 01/27/23 1104  Pulse 70 01/27/23 1116  Resp 13 01/27/23 1116  SpO2 97 % 01/27/23 1116  Vitals shown include unvalidated device data.  Last Pain:  Vitals:   01/27/23 1104  TempSrc:   PainSc: 0-No pain      Patients Stated Pain Goal: 0 (01/27/23 4098)  Complications: No notable events documented.

## 2023-01-27 NOTE — Anesthesia Postprocedure Evaluation (Signed)
Anesthesia Post Note  Patient: Erika Smith  Procedure(s) Performed: HARDWARE REMOVAL (Left: Leg Lower) ARTHROSCOPY KNEE (Left: Knee)     Patient location during evaluation: PACU Anesthesia Type: General Level of consciousness: awake and alert Pain management: pain level controlled Vital Signs Assessment: post-procedure vital signs reviewed and stable Respiratory status: spontaneous breathing, nonlabored ventilation and respiratory function stable Cardiovascular status: blood pressure returned to baseline Postop Assessment: no apparent nausea or vomiting Anesthetic complications: no Comments: Prolonged emergence. VSS. No respiratory concerns. SpO2 high 90s throughout PACU stay. Unclear etiology. Patient awake and conversant on discharge. Stephannie Peters, MD   No notable events documented.  Last Vitals:  Vitals:   01/27/23 1230 01/27/23 1245  BP: (!) 125/98 (!) 136/91  Pulse: 75 78  Resp: 20 20  Temp:    SpO2: 96% 95%    Last Pain:  Vitals:   01/27/23 1230  TempSrc:   PainSc: 0-No pain                 Shanda Howells

## 2023-01-27 NOTE — Anesthesia Procedure Notes (Signed)
Procedure Name: LMA Insertion Date/Time: 01/27/2023 8:43 AM  Performed by: Elgie Congo, CRNAPre-anesthesia Checklist: Patient identified, Emergency Drugs available, Suction available and Patient being monitored Patient Re-evaluated:Patient Re-evaluated prior to induction Oxygen Delivery Method: Circle system utilized Induction Type: IV induction Ventilation: Mask ventilation without difficulty LMA: LMA inserted LMA Size: 4.0 Number of attempts: 1 Placement Confirmation: positive ETCO2 and breath sounds checked- equal and bilateral Tube secured with: Tape Dental Injury: Teeth and Oropharynx as per pre-operative assessment

## 2023-01-27 NOTE — Brief Op Note (Signed)
01/27/2023  11:58 AM  PATIENT:  Erika Smith  67 y.o. female  [id number]

## 2023-01-27 NOTE — Op Note (Signed)
[name], Smith MEDICAL RECORD NO: [id number] ACCOUNT NO: [id number] DATE OF BIRTH: June 21, 1956 FACILITY: [facility] LOCATION: [facility] PHYSICIAN: Doralee Albino. Carola Frost, MD  Operative Report   DATE OF PROCEDURE: 01/27/2023  PREOPERATIVE DIAGNOSES:   1.  Symptomatic hardware, left tibia. 2.  Internal derangement, left knee.  POSTOPERATIVE DIAGNOSES: 1.  Symptomatic hardware, left tibia. 2.  Left medial meniscus tear. 3.  Left lateral meniscus tear. 4.  Extensive synovitis lateral and anterior compartments. 5.  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  PROCEDURES:   1.  Arthroscopic medial and lateral partial meniscectomies. 2.  Arthroscopic synovectomy anterior and lateral compartments. 3.  Excision of medial plica. 4.  Removal of deep implant, left tibia.  SURGEON:  Doralee Albino. Carola Frost, MD.  ASSISTANT:  Montez Morita, PA-C.  ANESTHESIA:  General.  COMPLICATIONS:  None.  TOURNIQUET:  None.  DISPOSITION:  To PACU.  CONDITION:  Stable.  BRIEF SUMMARY:  The patient is a very pleasant 67 year old female who sustained injury to the left knee.  She was treated with open reduction internal fixation initially was doing well; however, developed progressive mechanical symptoms with locking,  catching and giving away in addition to a palpable clunk, which was painful.  Because of the adjacent hardware and symptoms attributable to that that had failed to resolve with conservative measures we deemed it most appropriate to continue with the plan  for removal of hardware and arthroscopic exploration versus getting an MRI, which would not be definitive because of the adjacent metal artifact.  Discussed with her the risks and benefits of surgery including the possibility of failure to resolve all  of her symptoms, need for further surgery, DVT, PE, loss of motion, occult nonunion and others.  She acknowledged these risks and did strongly wished to proceed.  BRIEF SUMMARY OF PROCEDURE:   The patient received preoperative antibiotics, was taken to the operating room where general anesthesia was induced.  The left lower extremity was prepped and draped in the usual sterile fashion using chlorhexidine wash and  Betadine scrub and paint.  After draping, timeout was held.  The knee was injected with Marcaine and epinephrine at the portal sites as well as some into the knee joint to help to control bleeding.  An 11 blade was used to create a standard portal sites.   A diagnostic arthroscopy was performed of the entire knee, identifying no significant chondral injury to the patella itself.  There was a lesion over the edge of the medial femoral condyle where a large medial plica was scraping directly over this with  flexion of the knee.  There was adjacent synovitis, in an area this synovitis was severe.  Evaluation of the medial compartment revealed a complex posterior horn tear with horizontal and lateral components.  Upon further probing the far posterior horn  attachment site adjacent to the cruciates was unstable and could flip in and out of the joint, which I demonstrated with the probe and arthroscopic pictures.  Fortunately, the root itself was secure inferior and more posterior to this lateral edge.  The  lateral compartment demonstrated a tear at the junction of the body and posterior horn, but no other horizontal or radial tears then just that one area.  There were no large loose bodies within the knee.  There was extensive synovitis laterally also  protruding into the joint.  I began with use of an arthroscopic up and side biters to debride the posterior horn meniscus tear.  It was then that  I identified the horizontal cleavage tear in the posterior horn and then also the unstable superior edge of the root adjacent to the  cruciates.  I changed portals and used a side biter to get the root area and debride this back to a stable base.  I continued with the upbiter for the horizontal  tear in the posterior horn.  This was tapered in and then refined with an arthroscopic  shaver.  The cartilage overall appeared to be very well preserved with a few small areas of grade II changes, but overall healthy appearing.  I then took the instruments into the anterior aspect of the knee and used the arthroscopic wand to resect the  medial plica.  Again, this was directly rubbing on the medial femoral condyle and there was an osteophyte here, which was documented with pictures as well as removal of the plica.  I then continued with synovectomy, demonstrating out the fronds were  being pinched within the joint and using the arthroscopic wand and shaver to remove this area.  I did have to change portals here also.  I then had adequate visibility to go into the lateral compartment.  Here, the arthroscopic shaver was used to debride  the tear back to a stable edge that could not displace into the joint.  Most of the lateral meniscus was intact and in good condition.  Lastly, I turned my attention to the lateral synovitis and had to establish a separate working portal for the  arthroscopic wand and shaver to come in from a suprapatellar lateral region to adequately reach this area.  Pre and post-pictures were taken here as well.  Instruments were then withdrawn from the knee after using the Arthrocare for some additional coagulation.  Then, turned attention to the tibia remaking the proximal incision through the old scar, carrying dissection down to the locking caps removing those in the screws without complication.  More distally, I made an additional longitudinal scar just over the  edge of the lateral border of the tibia and with the help of my assistant I was able to expose the screws here and withdraw them without complication.  The plate was gently rocked and distracted.  Wounds were irrigated thoroughly and then the wounds  closed in standard layered fashion with 0 Vicryl, 2-0 Vicryl and 2-0  nylon.  Montez Morita, PA-C did assist me during the case.  The patient was awakened from anesthesia and transferred to PACU in stable condition.  Additional local anesthetic was injected  into the wounds and knee.  PROGNOSIS:  The patient will be weightbearing as tolerated with unrestricted range of motion.  She has been encouraged to aggressively ice and elevate and will see her back for removal of sutures in 10 days.   PUS D: 01/27/2023 12:18:47 pm T: 01/27/2023 12:55:00 pm  JOB: [id number]/ [id number]

## 2023-01-27 NOTE — Discharge Instructions (Addendum)
[facility] Discharge Instructions   General Discharge Instructions  WEIGHT BEARING STATUS: Weightbearing as tolerated  RANGE OF MOTION/ACTIVITY: as tolerated, slowly increase activity    Wound Care: daily wound care starting on 01/29/2023   Discharge Wound Care Instructions  Do NOT apply any ointments, solutions or lotions to pin sites or surgical wounds.  These prevent needed drainage and even though solutions like hydrogen peroxide kill bacteria, they also damage cells lining the pin sites that help fight infection.  Applying lotions or ointments can keep the wounds moist and can cause them to breakdown and open up as well. This can increase the risk for infection. When in doubt call the office.  Surgical incisions should be dressed daily.  If any drainage is noted, use one layer of adaptic or Mepitel, then gauze, Kerlix, and an ace wrap.  [email or web address] [email or web address]  [email or web address]  These dressing supplies should be available at local medical supply stores ([employer], [employer], etc). They are not usually carried at places like [facility], [facility], [employer], etc  Once the incision is completely dry and without drainage, it may be left open to air out.  Showering may begin 36-48 hours later.  Cleaning gently with soap and water.   Diet: as you were eating previously.  Can use over the counter stool softeners and bowel preparations, such as Miralax, to help with bowel movements.  Narcotics can be constipating.  Be sure to drink plenty of fluids  PAIN MEDICATION USE AND EXPECTATIONS  You have likely been given narcotic medications to help control your pain.  After a traumatic event that results in an fracture (broken bone) with or  without surgery, it is ok to use narcotic pain medications to help control one's pain.  We understand that everyone responds to pain differently and each individual patient will be evaluated on a regular basis for the continued need for narcotic medications. Ideally, narcotic medication use should last no more than 6-8 weeks (coinciding with fracture healing).   As a patient it is your responsibility as well to monitor narcotic medication use and report the amount and frequency you use these medications when you come to your office visit.   We would also advise that if you are using narcotic medications, you should take a dose prior to therapy to maximize you participation.  IF YOU ARE ON NARCOTIC MEDICATIONS IT IS NOT PERMISSIBLE TO OPERATE A MOTOR VEHICLE (MOTORCYCLE/CAR/TRUCK/MOPED) OR HEAVY MACHINERY DO NOT MIX NARCOTICS WITH OTHER CNS (CENTRAL NERVOUS SYSTEM) DEPRESSANTS SUCH AS ALCOHOL   POST-OPERATIVE OPIOID TAPER INSTRUCTIONS: It is important to wean off of your opioid medication as soon as possible. If you do not need pain medication after your surgery it is ok to stop day one. Opioids include: Codeine, Hydrocodone(Norco, Vicodin), Oxycodone(Percocet, oxycontin) and hydromorphone amongst others.  Long term and even short term use of opiods can cause: Increased pain response Dependence Constipation Depression Respiratory depression And more.  Withdrawal symptoms can include Flu like symptoms Nausea, vomiting And more Techniques to manage these symptoms Hydrate well Eat regular healthy meals Stay active Use relaxation techniques(deep breathing, meditating, yoga) Do Not substitute Alcohol to help with tapering If you have been on opioids for less than two weeks and do not have pain than it is ok to stop all together.  Plan to wean off of opioids This plan should start within one week post op of your fracture surgery  Maintain the same interval or time between taking each dose  and first decrease the dose.  Cut the total daily intake of opioids by one tablet each day Next start to increase the time between doses. The last dose that should be eliminated is the evening dose.    STOP SMOKING OR USING NICOTINE PRODUCTS!!!!  As discussed nicotine severely impairs your body's ability to heal surgical and traumatic wounds but also impairs bone healing.  Wounds and bone heal by forming microscopic blood vessels (angiogenesis) and nicotine is a vasoconstrictor (essentially, shrinks blood vessels).  Therefore, if vasoconstriction occurs to these microscopic blood vessels they essentially disappear and are unable to deliver necessary nutrients to the healing tissue.  This is one modifiable factor that you can do to dramatically increase your chances of healing your injury.    (This means no smoking, no nicotine gum, patches, etc)  DO NOT USE NONSTEROIDAL ANTI-INFLAMMATORY DRUGS (NSAID'S)  Using products such as Advil (ibuprofen), Aleve (naproxen), Motrin (ibuprofen) for additional pain control during fracture healing can delay and/or prevent the healing response.  If you would like to take over the counter (OTC) medication, Tylenol (acetaminophen) is ok.  However, some narcotic medications that are given for pain control contain acetaminophen as well. Therefore, you should not exceed more than 4000 mg of tylenol in a day if you do not have liver disease.  Also note that there are may OTC medicines, such as cold medicines and allergy medicines that my contain tylenol as well.  If you have any questions about medications and/or interactions please ask your doctor/PA or your pharmacist.      ICE AND ELEVATE INJURED/OPERATIVE EXTREMITY  Using ice and elevating the injured extremity above your heart can help with swelling and pain control.  Icing in a pulsatile fashion, such as 20 minutes on and 20 minutes off, can be followed.    Do not place ice directly on skin. Make sure there is a  barrier between to skin and the ice pack.    Using frozen items such as frozen peas works well as the conform nicely to the are that needs to be iced.  USE AN ACE WRAP OR TED HOSE FOR SWELLING CONTROL  In addition to icing and elevation, Ace wraps or TED hose are used to help limit and resolve swelling.  It is recommended to use Ace wraps or TED hose until you are informed to stop.    When using Ace Wraps start the wrapping distally (farthest away from the body) and wrap proximally (closer to the body)   Example: If you had surgery on your leg or thing and you do not have a splint on, start the ace wrap at the toes and work your way up to the thigh        If you had surgery on your upper extremity and do not have a splint on, start the ace wrap at your fingers and work your way up to the upper arm  IF YOU ARE IN A SPLINT OR CAST DO NOT REMOVE IT FOR ANY REASON   If your splint gets wet for any reason please contact the office immediately. You may shower in your splint or cast as long as you keep it dry.  This can be done by wrapping in a cast cover or garbage back (or similar)  Do Not stick any thing down your splint or cast such as pencils, money, or hangers to try and scratch yourself with.  If you feel itchy take benadryl as prescribed on the bottle  for itching  IF YOU ARE IN A CAM BOOT (BLACK BOOT)  You may remove boot periodically. Perform daily dressing changes as noted below.  Wash the liner of the boot regularly and wear a sock when wearing the boot. It is recommended that you sleep in the boot until told otherwise    Call office for the following: Temperature greater than 101F Persistent nausea and vomiting Severe uncontrolled pain Redness, tenderness, or signs of infection (pain, swelling, redness, odor or green/yellow discharge around the site) Difficulty breathing, headache or visual disturbances Hives Persistent dizziness or light-headedness Extreme fatigue Any other questions  or concerns you may have after discharge  In an emergency, call 911 or go to an Emergency Department at a nearby hospital  HELPFUL INFORMATION  If you had a block, it will wear off between 8-24 hrs postop typically.  This is period when your pain may go from nearly zero to the pain you would have had postop without the block.  This is an abrupt transition but nothing dangerous is happening.  You may take an extra dose of narcotic when this happens.  You should wean off your narcotic medicines as soon as you are able.  Most patients will be off or using minimal narcotics before their first postop appointment.   We suggest you use the pain medication the first night prior to going to bed, in order to ease any pain when the anesthesia wears off. You should avoid taking pain medications on an empty stomach as it will make you nauseous.  Do not drink alcoholic beverages or take illicit drugs when taking pain medications.  In most states it is against the law to drive while you are in a splint or sling.  And certainly against the law to drive while taking narcotics.  You may return to work/school in the next couple of days when you feel up to it.   Pain medication may make you constipated.  Below are a few solutions to try in this order: Decrease the amount of pain medication if you aren't having pain. Drink lots of decaffeinated fluids. Drink prune juice and/or each dried prunes  If the first 3 don't work start with additional solutions Take Colace - an over-the-counter stool softener Take Senokot - an over-the-counter laxative Take Miralax - a stronger over-the-counter laxative     CALL THE OFFICE WITH ANY QUESTIONS OR CONCERNS: [phone]   VISIT OUR WEBSITE FOR ADDITIONAL INFORMATION: [email or web address]      Discharge Pin Site Instructions  Dress pins daily with Kerlix roll starting on POD 2. Wrap the Kerlix so that it tamps the skin down around the pin-skin interface to  prevent/limit motion of the skin relative to the pin.  (Pin-skin motion is the primary cause of pain and infection related to external fixator pin sites).  Remove any crust or coagulum that may obstruct drainage with soap and water.  After POD 3, if there is no discernable drainage on the pin site dressing, the interval for change can by increased to every other day.  You may shower with the fixator, cleaning all pin sites gently with soap and water.  If you have a surgical wound this needs to be completely dry and without drainage before showering. Alternatively you can use a washcloth with soap and water and gently clean the injured extremity and external fixator, including all pinsites and surgical wounds   The extremity can be lifted by the fixator to facilitate wound care and transfers.  Notify the office/Doctor if you experience increasing drainage, redness, or pain from a pin site, or if you notice purulent (thick, snot-like) drainage. As we discussed pin tract infections are common in this is most likely as a result of mechanical irritation from the skin pin interface. Primary treatment is hygiene and cleaning with soap and water. If this does not resolve with regular cleaning contact the office

## 2023-01-28 ENCOUNTER — Encounter (HOSPITAL_COMMUNITY): Payer: Self-pay | Admitting: Orthopedic Surgery

## 2023-04-25 ENCOUNTER — Other Ambulatory Visit: Payer: Self-pay | Admitting: Family Medicine

## 2023-04-25 DIAGNOSIS — E785 Hyperlipidemia, unspecified: Secondary | ICD-10-CM

## 2023-04-25 NOTE — Telephone Encounter (Signed)
Requested medication (s) are due for refill today: yes  Requested medication (s) are on the active medication list: yes    Last refill: 11/08/22 #90  0 refills  Future visit scheduled No  Notes to clinic:Attempted to reach pt, unsure pt still at practice. Unable to reach. Please advise  Requested Prescriptions  Pending Prescriptions Disp Refills   atorvastatin (LIPITOR) 20 MG tablet [Pharmacy Med Name: ATORVASTATIN 20 MG TABLET] 90 tablet 0    Sig: TAKE 1 TABLET BY MOUTH EVERY DAY     Cardiovascular:  Antilipid - Statins Failed - 04/25/2023  2:28 AM      Failed - Valid encounter within last 12 months    Recent Outpatient Visits           1 year ago Left hip pain   [facility] Merita Norton T, FNP   2 years ago Aortic atherosclerosis [facility])   [facility] Malva Limes, MD   2 years ago Cough   [facility] Osvaldo Angst M, [place]   3 years ago Lymphadenopathy   [facility] Bosie Clos, MD   4 years ago Chest pain, unspecified type   [facility] Malva Limes, MD              Failed - Lipid Panel in normal range within the last 12 months    Cholesterol, Total  Date Value Ref Range Status  12/03/2020 198 100 - 199 mg/dL Final   LDL Chol Calc (NIH)  Date Value Ref Range Status  12/03/2020 110 (H) 0 - 99 mg/dL Final   HDL  Date Value Ref Range Status  12/03/2020 73 >39 mg/dL Final   Triglycerides  Date Value Ref Range Status  12/03/2020 81 0 - 149 mg/dL Final         Passed - Patient is not pregnant

## 2023-06-17 ENCOUNTER — Ambulatory Visit
Admission: RE | Admit: 2023-06-17 | Discharge: 2023-06-17 | Disposition: A | Discharge status: Home / Self Care | Payer: Medicare HMO | Source: Ambulatory Visit | Attending: Internal Medicine

## 2023-06-17 ENCOUNTER — Other Ambulatory Visit: Payer: Self-pay | Admitting: Internal Medicine

## 2023-06-17 DIAGNOSIS — M79622 Pain in left upper arm: Secondary | ICD-10-CM

## 2023-06-17 DIAGNOSIS — M25512 Pain in left shoulder: Secondary | ICD-10-CM

## 2023-07-04 ENCOUNTER — Other Ambulatory Visit: Payer: Self-pay | Admitting: Internal Medicine

## 2023-07-04 DIAGNOSIS — Z13820 Encounter for screening for osteoporosis: Secondary | ICD-10-CM

## 2023-07-04 DIAGNOSIS — Z1231 Encounter for screening mammogram for malignant neoplasm of breast: Secondary | ICD-10-CM

## 2023-07-19 ENCOUNTER — Other Ambulatory Visit: Payer: Self-pay | Admitting: Internal Medicine

## 2023-07-19 ENCOUNTER — Ambulatory Visit
Admission: RE | Admit: 2023-07-19 | Discharge: 2023-07-19 | Disposition: A | Discharge status: Home / Self Care | Payer: Medicare HMO | Attending: Internal Medicine | Admitting: Internal Medicine

## 2023-07-19 ENCOUNTER — Ambulatory Visit
Admission: RE | Admit: 2023-07-19 | Discharge: 2023-07-19 | Disposition: A | Discharge status: Home / Self Care | Payer: Medicare HMO | Source: Ambulatory Visit | Attending: Internal Medicine

## 2023-07-19 DIAGNOSIS — M79671 Pain in right foot: Secondary | ICD-10-CM

## 2023-08-29 ENCOUNTER — Other Ambulatory Visit: Payer: Self-pay | Admitting: Internal Medicine

## 2023-08-29 ENCOUNTER — Ambulatory Visit
Admission: RE | Admit: 2023-08-29 | Discharge: 2023-08-29 | Disposition: A | Discharge status: Home / Self Care | Payer: Medicare HMO | Attending: Internal Medicine | Admitting: Internal Medicine

## 2023-08-29 ENCOUNTER — Ambulatory Visit
Admission: RE | Admit: 2023-08-29 | Discharge: 2023-08-29 | Disposition: A | Discharge status: Home / Self Care | Payer: Medicare HMO | Source: Ambulatory Visit | Attending: Internal Medicine

## 2023-08-29 DIAGNOSIS — M069 Rheumatoid arthritis, unspecified: Secondary | ICD-10-CM | POA: Diagnosis present

## 2023-09-05 ENCOUNTER — Other Ambulatory Visit: Payer: Self-pay | Admitting: Internal Medicine

## 2023-09-05 DIAGNOSIS — M05772 Rheumatoid arthritis with rheumatoid factor of left ankle and foot without organ or systems involvement: Secondary | ICD-10-CM

## 2023-09-05 DIAGNOSIS — M05741 Rheumatoid arthritis with rheumatoid factor of right hand without organ or systems involvement: Secondary | ICD-10-CM

## 2023-09-05 DIAGNOSIS — M05771 Rheumatoid arthritis with rheumatoid factor of right ankle and foot without organ or systems involvement: Secondary | ICD-10-CM

## 2023-09-28 ENCOUNTER — Ambulatory Visit
Admission: RE | Admit: 2023-09-28 | Discharge: 2023-09-28 | Disposition: A | Discharge status: Home / Self Care | Payer: Medicare HMO | Source: Ambulatory Visit | Attending: Internal Medicine | Admitting: Internal Medicine

## 2023-09-28 DIAGNOSIS — Z1231 Encounter for screening mammogram for malignant neoplasm of breast: Secondary | ICD-10-CM | POA: Diagnosis not present

## 2023-09-28 DIAGNOSIS — M069 Rheumatoid arthritis, unspecified: Secondary | ICD-10-CM | POA: Diagnosis not present

## 2023-09-28 DIAGNOSIS — Z13820 Encounter for screening for osteoporosis: Secondary | ICD-10-CM | POA: Diagnosis present

## 2023-09-28 DIAGNOSIS — M8589 Other specified disorders of bone density and structure, multiple sites: Secondary | ICD-10-CM | POA: Diagnosis not present

## 2023-10-03 ENCOUNTER — Inpatient Hospital Stay
Admission: RE | Admit: 2023-10-03 | Discharge: 2023-10-03 | Disposition: A | Discharge status: Home / Self Care | Payer: Self-pay | Source: Ambulatory Visit | Attending: Internal Medicine | Admitting: Internal Medicine

## 2023-10-03 ENCOUNTER — Other Ambulatory Visit: Payer: Self-pay | Admitting: *Deleted

## 2023-10-03 DIAGNOSIS — Z1231 Encounter for screening mammogram for malignant neoplasm of breast: Secondary | ICD-10-CM
